# Patient Record
Sex: Female | Born: 1995 | Race: Black or African American | Hispanic: No | Marital: Single | Smoking: Former smoker
Health system: Southern US, Community
[De-identification: ages and names within clinical notes are randomized; demographics above are authoritative.]

## PROBLEM LIST (undated history)

## (undated) DIAGNOSIS — N83209 Unspecified ovarian cyst, unspecified side: Secondary | ICD-10-CM

## (undated) DIAGNOSIS — A749 Chlamydial infection, unspecified: Secondary | ICD-10-CM

## (undated) DIAGNOSIS — I10 Essential (primary) hypertension: Secondary | ICD-10-CM

## (undated) DIAGNOSIS — B999 Unspecified infectious disease: Secondary | ICD-10-CM

## (undated) DIAGNOSIS — O139 Gestational [pregnancy-induced] hypertension without significant proteinuria, unspecified trimester: Secondary | ICD-10-CM

---
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
## 2014-06-02 ENCOUNTER — Encounter (HOSPITAL_COMMUNITY): Payer: Self-pay | Admitting: *Deleted

## 2014-10-29 ENCOUNTER — Ambulatory Visit: Payer: Medicaid Other | Admitting: Obstetrics & Gynecology

## 2015-03-18 ENCOUNTER — Encounter: Payer: Self-pay | Admitting: Family Medicine

## 2015-03-18 ENCOUNTER — Ambulatory Visit (INDEPENDENT_AMBULATORY_CARE_PROVIDER_SITE_OTHER): Payer: Managed Care, Other (non HMO) | Admitting: Family Medicine

## 2015-03-18 VITALS — BP 137/102 | HR 90 | Temp 98.6°F | Ht 67.0 in | Wt 200.5 lb

## 2015-03-18 DIAGNOSIS — Z3049 Encounter for surveillance of other contraceptives: Secondary | ICD-10-CM

## 2015-03-18 DIAGNOSIS — Z3046 Encounter for surveillance of implantable subdermal contraceptive: Secondary | ICD-10-CM

## 2015-03-18 MED ORDER — AMLODIPINE BESYLATE 10 MG PO TABS: 10.0000 mg | ORAL_TABLET | Freq: Every day | ORAL | Status: DC

## 2015-03-18 MED ORDER — NORGESTIMATE-ETH ESTRADIOL 0.25-35 MG-MCG PO TABS: 1.0000 | ORAL_TABLET | Freq: Every day | ORAL | Status: DC

## 2015-03-18 NOTE — Progress Notes (Signed)
Nexplanon Removal:  Patient given informed consent for removal of her Implanon, time out was performed.  Signed copy in the chart.  Appropriate time out taken. Implanon site identified.  Area prepped in usual sterile fashon. 3mL of 1% lidocaine was used to anesthetize the area at the distal end of the implant. A small stab incision was made right beside the implant on the distal portion.  The implanon rod was grasped using hemostats and removed without difficulty.  There was less than 3 cc blood loss. There were no complications.  A small amount of antibiotic ointment and steri-strips were applied over the small incision.  A pressure bandage was applied to reduce any bruising.  The patient tolerated the procedure well and was given post procedure instructions.  Sprintec prescribed.  Instructions on how to use given

## 2015-03-18 NOTE — Addendum Note (Signed)
Addended by: Levie Heritage on: 03/18/2015 02:31 PM   Modules accepted: Orders

## 2015-03-18 NOTE — Progress Notes (Signed)
Patient ID: [name] [name], female   DOB: 1995/11/18, 19 y.o.   MRN: [id number] Pt desires to have nexplanon removed, feels it is the cause of abnormal bleeding, cyst on ovaries.  Pt states she has difficulty sleeping.

## 2015-07-13 ENCOUNTER — Other Ambulatory Visit: Payer: Self-pay | Admitting: Family Medicine

## 2015-09-22 LAB — OB RESULTS CONSOLE HIV ANTIBODY (ROUTINE TESTING): HIV: NONREACTIVE

## 2015-09-22 LAB — OB RESULTS CONSOLE GC/CHLAMYDIA
CHLAMYDIA, DNA PROBE: NEGATIVE
GC PROBE AMP, GENITAL: NEGATIVE

## 2015-09-22 LAB — OB RESULTS CONSOLE ABO/RH: RH Type: POSITIVE

## 2015-09-22 LAB — OB RESULTS CONSOLE HGB/HCT, BLOOD
HCT: 37 %
HEMOGLOBIN: 12.7 g/dL

## 2015-09-22 LAB — OB RESULTS CONSOLE RUBELLA ANTIBODY, IGM: Rubella: IMMUNE

## 2015-09-22 LAB — OB RESULTS CONSOLE HEPATITIS B SURFACE ANTIGEN: HEP B S AG: NEGATIVE

## 2015-09-22 LAB — OB RESULTS CONSOLE RPR: RPR: NONREACTIVE

## 2015-09-22 LAB — OB RESULTS CONSOLE ANTIBODY SCREEN: Antibody Screen: NEGATIVE

## 2015-11-25 ENCOUNTER — Encounter (HOSPITAL_COMMUNITY): Payer: Self-pay | Admitting: *Deleted

## 2015-11-25 ENCOUNTER — Inpatient Hospital Stay (HOSPITAL_COMMUNITY)
Admission: AD | Admit: 2015-11-25 | Discharge: 2015-11-25 | Disposition: A | Discharge status: Home / Self Care | Payer: Managed Care, Other (non HMO) | Source: Ambulatory Visit | Attending: Obstetrics and Gynecology | Admitting: Obstetrics and Gynecology

## 2015-11-25 ENCOUNTER — Encounter: Payer: Self-pay | Admitting: *Deleted

## 2015-11-25 DIAGNOSIS — O10013 Pre-existing essential hypertension complicating pregnancy, third trimester: Secondary | ICD-10-CM | POA: Diagnosis not present

## 2015-11-25 DIAGNOSIS — L259 Unspecified contact dermatitis, unspecified cause: Secondary | ICD-10-CM | POA: Insufficient documentation

## 2015-11-25 DIAGNOSIS — R21 Rash and other nonspecific skin eruption: Secondary | ICD-10-CM | POA: Diagnosis present

## 2015-11-25 DIAGNOSIS — Z3A29 29 weeks gestation of pregnancy: Secondary | ICD-10-CM

## 2015-11-25 DIAGNOSIS — O9989 Other specified diseases and conditions complicating pregnancy, childbirth and the puerperium: Secondary | ICD-10-CM

## 2015-11-25 DIAGNOSIS — Z87891 Personal history of nicotine dependence: Secondary | ICD-10-CM | POA: Diagnosis not present

## 2015-11-25 DIAGNOSIS — O26893 Other specified pregnancy related conditions, third trimester: Secondary | ICD-10-CM | POA: Insufficient documentation

## 2015-11-25 LAB — URINALYSIS, ROUTINE W REFLEX MICROSCOPIC
Bilirubin Urine: NEGATIVE
Glucose, UA: NEGATIVE mg/dL
HGB URINE DIPSTICK: NEGATIVE
Ketones, ur: NEGATIVE mg/dL
LEUKOCYTES UA: NEGATIVE
NITRITE: NEGATIVE
PROTEIN: NEGATIVE mg/dL
Specific Gravity, Urine: 1.02 (ref 1.005–1.030)
pH: 6.5 (ref 5.0–8.0)

## 2015-11-25 MED ORDER — HYDROCORTISONE 1 % EX CREA
TOPICAL_CREAM | CUTANEOUS | Status: DC
Start: 2015-11-25 — End: 2015-11-25

## 2015-11-25 MED ORDER — HYDROCORTISONE 1 % EX CREA: TOPICAL_CREAM | CUTANEOUS | Status: DC

## 2015-11-25 NOTE — H&P (Signed)
[name] Knackbony [name] [name] is an 20 y.o. female. HPI  [name] [name] is a 20 yo female with a history of severe pre-eclampsia who presents with a 2 week history of a bumpy, itchy rash and a one day history of hives. Patient states that she first noticed a rash in her right lower quadrant that occupied her stretch marks (but not the marks on the left side). The rash was very itchy, raised and red. She has treated the itchiness with an alcohol swab which she says provides temporary relief. Patient notes earlier use of a cocoa butter cream recommended to relieve stretch marks which she discontinued after the onset of the rash.   Yesterday, she noted a group of 6+ itchy hives on her left arm. She reports that many have disappeared but several remain and are still itchy. She denies any shortness of breath, tightness of throat, or dizziness at the onset of hives. Similarly, she denies the use of any new cloth, lotions/detergents, or foods over the last several weeks (other than the since discontinued stretch mark cream).   She has no known allergies, history of asthma or eczema.   She confirms continued fetal movements; denies contractions, vaginal bleeding, abdominal pain, and substance use.  Review of Systems  Constitutional: Negative for fever and chills.  Eyes: Negative for blurred vision.  Respiratory: Negative for shortness of breath and wheezing.   Cardiovascular: Negative for chest pain and palpitations.  Gastrointestinal: Positive for blood in stool. Negative for nausea, vomiting, abdominal pain and diarrhea.  Skin: Positive for itching and rash.  Neurological: Negative for dizziness, weakness and headaches.  Endo/Heme/Allergies: Negative for environmental allergies.      Blood pressure 133/84, pulse 78, temperature 98.3 F (36.8 C), temperature source Oral, resp. rate 18, last menstrual period 03/06/2015. Physical Exam  Constitutional: She appears well-developed and well-nourished.   Cardiovascular: Normal rate, regular rhythm, normal heart sounds and intact distal pulses.  Exam reveals no gallop and no friction rub.   No murmur heard. Respiratory: Effort normal and breath sounds normal. No respiratory distress. She has no wheezes. She has no rales. She exhibits no tenderness.  GI: Soft. Bowel sounds are normal. She exhibits no distension. There is no tenderness.  Skin: Rash noted.      Assessment/Plan [name] [name] is a 20 year old presenting with a series of itchy episodes without an atopic history or known allergies. Most likely these are discrete episodes or pruritic rashes and can be managed with with an anti-histamine and topical steroid cream.   [name] 11/25/2015, 3:30 PM

## 2015-11-25 NOTE — Discharge Instructions (Signed)
Contact Dermatitis Dermatitis is redness, soreness, and swelling (inflammation) of the skin. Contact dermatitis is a reaction to certain substances that touch the skin. There are two types of contact dermatitis:   Irritant contact dermatitis. This type is caused by something that irritates your skin, such as dry hands from washing them too much. This type does not require previous exposure to the substance for a reaction to occur. This type is more common.  Allergic contact dermatitis. This type is caused by a substance that you are allergic to, such as a nickel allergy or poison ivy. This type only occurs if you have been exposed to the substance (allergen) before. Upon a repeat exposure, your body reacts to the substance. This type is less common. CAUSES  Many different substances can cause contact dermatitis. Irritant contact dermatitis is most commonly caused by exposure to:   Makeup.   Soaps.   Detergents.   Bleaches.   Acids.   Metal salts, such as nickel.  Allergic contact dermatitis is most commonly caused by exposure to:   Poisonous plants.   Chemicals.   Jewelry.   Latex.   Medicines.   Preservatives in products, such as clothing.  RISK FACTORS This condition is more likely to develop in:   People who have jobs that expose them to irritants or allergens.  People who have certain medical conditions, such as asthma or eczema.  SYMPTOMS  Symptoms of this condition may occur anywhere on your body where the irritant has touched you or is touched by you. Symptoms include:  Dryness or flaking.   Redness.   Cracks.   Itching.   Pain or a burning feeling.   Blisters.  Drainage of small amounts of blood or clear fluid from skin cracks. With allergic contact dermatitis, there may also be swelling in areas such as the eyelids, mouth, or genitals.  DIAGNOSIS  This condition is diagnosed with a medical history and physical exam. A patch skin test  may be performed to help determine the cause. If the condition is related to your job, you may need to see an occupational medicine specialist. TREATMENT Treatment for this condition includes figuring out what caused the reaction and protecting your skin from further contact. Treatment may also include:   Steroid creams or ointments. Oral steroid medicines may be needed in more severe cases.  Antibiotics or antibacterial ointments, if a skin infection is present.  Antihistamine lotion or an antihistamine taken by mouth to ease itching.  A bandage (dressing). HOME CARE INSTRUCTIONS Skin Care  Moisturize your skin as needed.   Apply cool compresses to the affected areas.  Try taking a bath with:  Epsom salts. Follow the instructions on the packaging. You can get these at your local pharmacy or grocery store.  Baking soda. Pour a small amount into the bath as directed by your health care provider.  Colloidal oatmeal. Follow the instructions on the packaging. You can get this at your local pharmacy or grocery store.  Try applying baking soda paste to your skin. Stir water into baking soda until it reaches a paste-like consistency.  Do not scratch your skin.  Bathe less frequently, such as every other day.  Bathe in lukewarm water. Avoid using hot water. Medicines  Take or apply over-the-counter and prescription medicines only as told by your health care provider.   If you were prescribed an antibiotic medicine, take or apply your antibiotic as told by your health care provider. Do not stop using the   antibiotic even if your condition starts to improve. General Instructions  Keep all follow-up visits as told by your health care provider. This is important.  Avoid the substance that caused your reaction. If you do not know what caused it, keep a journal to try to track what caused it. Write down:  What you eat.  What cosmetic products you use.  What you drink.  What  you wear in the affected area. This includes jewelry.  If you were given a dressing, take care of it as told by your health care provider. This includes when to change and remove it. SEEK MEDICAL CARE IF:   Your condition does not improve with treatment.  Your condition gets worse.  You have signs of infection such as swelling, tenderness, redness, soreness, or warmth in the affected area.  You have a fever.  You have new symptoms. SEEK IMMEDIATE MEDICAL CARE IF:   You have a severe headache, neck pain, or neck stiffness.  You vomit.  You feel very sleepy.  You notice red streaks coming from the affected area.  Your bone or joint underneath the affected area becomes painful after the skin has healed.  The affected area turns darker.  You have difficulty breathing.   This information is not intended to replace advice given to you by your health care provider. Make sure you discuss any questions you have with your health care provider.   Document Released: 07/15/2000 Document Revised: 04/08/2015 Document Reviewed: 12/03/2014 Elsevier Interactive Patient Education 2016 [employer]  

## 2015-11-25 NOTE — MAU Note (Signed)
Pt C/O itching & a rash on her abdomen x 2 weeks, broke out in hives on her arms yesterday, is mostly gone now.  Has been nauseated since yesterday, unable to eat but is able to drink.  Denies abd pain, bleeding or LOF.

## 2015-11-25 NOTE — MAU Provider Note (Signed)
Chief Complaint:  Rash and Nausea   First Provider Initiated Contact with Patient 11/25/15 1603      HPI: [name] [name] is a 20 y.o. G2P0101 at 32w6dwho presents to maternity admissions reporting onset of rash x 2 weeks on her left lower abdomen associated with her stretch marks. She reports she was using cocoa butter on her stretch marks but discontinued use when the rash started.  She is using an alcohol swab on the rash which relieves itching.  Yesterday, she had new onset of 6-10 itchy bumps on her left arm. She has not tried any treatments for these.  She reports good fetal movement, denies abdominal pain, LOF, vaginal bleeding, vaginal itching/burning, urinary symptoms, h/a, dizziness, n/v, or fever/chills.     HPI  Past Medical History: Past Medical History  Diagnosis Date  . Medical history non-contributory   . Hypertension     Past obstetric history: OB History  Gravida Para Term Preterm AB SAB TAB Ectopic Multiple Living     # Outcome Date GA Lbr Len/2nd Weight Sex Delivery Anes PTL Lv  2 Current           1 Preterm 03/21/13 [redacted]w[redacted]d  3 lb 1 oz (1.389 kg) F CS-LTranv Spinal  Y      Past Surgical History: Past Surgical History  Procedure Laterality Date  . No past surgeries    . Cesarean section N/A 03/21/2013    Procedure: CESAREAN SECTION;  Surgeon: Willodean Rosenthal, MD;  Location: [facility];  Service: Obstetrics;  Laterality: N/A;    Family History: Family History  Problem Relation Age of Onset  . Asthma Mother     Social History: Social History  Substance Use Topics  . Smoking status: Former Smoker    Quit date: 07/27/2015  . Smokeless tobacco: Never Used  . Alcohol Use: No    Allergies: No Known Allergies  Meds:  Prescriptions prior to admission  Medication Sig Dispense Refill Last Dose  . acetaminophen (TYLENOL) 500 MG tablet Take 1,000 mg by mouth every 6 (six) hours as needed for mild pain.   Taking  . amLODipine (NORVASC)  10 MG tablet TAKE 1 TABLET (10 MG TOTAL) BY MOUTH DAILY. 60 tablet 0   . norgestimate-ethinyl estradiol (ORTHO-CYCLEN,SPRINTEC,PREVIFEM) 0.25-35 MG-MCG tablet Take 1 tablet by mouth daily. 1 Package 11     ROS:  Review of Systems  Constitutional: Negative for fever, chills and fatigue.  Eyes: Negative for visual disturbance.  Respiratory: Negative for shortness of breath.   Cardiovascular: Negative for chest pain.  Gastrointestinal: Positive for nausea. Negative for vomiting and abdominal pain.  Genitourinary: Negative for dysuria, flank pain, vaginal bleeding, vaginal discharge, difficulty urinating, vaginal pain and pelvic pain.  Skin: Positive for rash.  Neurological: Negative for dizziness and headaches.  Psychiatric/Behavioral: Negative.      I have reviewed patient's Past Medical Hx, Surgical Hx, Family Hx, Social Hx, medications and allergies.   Physical Exam  Patient Vitals for the past 24 hrs:  BP Temp Temp src Pulse Resp  11/25/15 1301 133/84 mmHg 98.3 F (36.8 C) Oral 78 18   Constitutional: Well-developed, well-nourished female in no acute distress.  Cardiovascular: normal rate Respiratory: normal effort GI: Abd soft, non-tender, gravid appropriate for gestational age.  MS: Extremities nontender, no edema, normal ROM Neurologic: Alert and oriented x 4.  GU: Neg CVAT.  Skin with raised, red papules along lines of striae on abdomen, and raised  red papules on left arm with erythema from scratching noted    FHT:  Baseline 145 , moderate variability, accelerations present, no decelerations Contractions: None on toco or to palpation   Labs: Results for orders placed or performed during the hospital encounter of 11/25/15 (from the past 24 hour(s))  Urinalysis, Routine w reflex microscopic (not at [facility])     Status: None   Collection Time: 11/25/15  1:05 PM  Result Value Ref Range   Color, Urine YELLOW YELLOW   APPearance CLEAR CLEAR   Specific Gravity, Urine 1.020  1.005 - 1.030   pH 6.5 5.0 - 8.0   Glucose, UA NEGATIVE NEGATIVE mg/dL   Hgb urine dipstick NEGATIVE NEGATIVE   Bilirubin Urine NEGATIVE NEGATIVE   Ketones, ur NEGATIVE NEGATIVE mg/dL   Protein, ur NEGATIVE NEGATIVE mg/dL   Nitrite NEGATIVE NEGATIVE   Leukocytes, UA NEGATIVE NEGATIVE      Imaging:  No results found.  MAU Course/MDM: I have ordered labs and reviewed results.  Rash in striae but otherwise not consistent with PUPPP since it is localized to one small area of abdomen.  Rash on left arm with similar itching but different appearance.  Most likely contact dermatitis although pt unaware of cause.  Will treat with topical hydrocortisone 1% cream BID and PO Benadryl 25 mg PRN.  F/U if rash persists or worsens.  Pt stable at time of discharge.  Assessment: 1. Contact dermatitis   2. [redacted] weeks gestation of pregnancy     Plan: Discharge home Labor precautions and fetal kick counts    Medication List    STOP taking these medications        amLODipine 10 MG tablet  Commonly known as:  NORVASC     norgestimate-ethinyl estradiol 0.25-35 MG-MCG tablet  Commonly known as:  ORTHO-CYCLEN,SPRINTEC,PREVIFEM      TAKE these medications        acetaminophen 500 MG tablet  Commonly known as:  TYLENOL  Take 1,000 mg by mouth every 6 (six) hours as needed for mild pain.     hydrocortisone cream 1 %  Apply to affected area 2 times daily       Follow-up Information    [facility]   Specialty:  Obstetrics and Gynecology   Why:  Use Benadryl by mouth according to package directions as needed for itching and hydrocortisone cream as prescribed   Contact information:   [address] [place] [id number] [phone]      Follow [facility] with [facility].   Why:  As needed for emergencies   Contact information:   [address] [id number] mc [place]  [id number] [phone]       Medication List    STOP taking these medications        amLODipine 10 MG tablet  Commonly known as:  NORVASC     norgestimate-ethinyl estradiol 0.25-35 MG-MCG tablet  Commonly known as:  ORTHO-CYCLEN,SPRINTEC,PREVIFEM      TAKE these medications        acetaminophen 500 MG tablet  Commonly known as:  TYLENOL  Take 1,000 mg by mouth every 6 (six) hours as needed for mild pain.     hydrocortisone cream 1 %  Apply to affected area 2 times daily        Sharen CounterLisa Leftwich-Kirby Certified Nurse-Midwife 11/25/2015 4:09 PM

## 2015-12-09 ENCOUNTER — Encounter (HOSPITAL_COMMUNITY): Payer: Self-pay | Admitting: *Deleted

## 2015-12-09 ENCOUNTER — Inpatient Hospital Stay (HOSPITAL_COMMUNITY)
Admission: AD | Admit: 2015-12-09 | Discharge: 2015-12-12 | DRG: 781 | Disposition: A | Discharge status: Home / Self Care | Payer: Managed Care, Other (non HMO) | Source: Ambulatory Visit | Attending: Obstetrics and Gynecology | Admitting: Obstetrics and Gynecology

## 2015-12-09 DIAGNOSIS — O10013 Pre-existing essential hypertension complicating pregnancy, third trimester: Secondary | ICD-10-CM | POA: Diagnosis present

## 2015-12-09 DIAGNOSIS — O113 Pre-existing hypertension with pre-eclampsia, third trimester: Principal | ICD-10-CM | POA: Diagnosis present

## 2015-12-09 DIAGNOSIS — O10919 Unspecified pre-existing hypertension complicating pregnancy, unspecified trimester: Secondary | ICD-10-CM | POA: Insufficient documentation

## 2015-12-09 DIAGNOSIS — Z3A32 32 weeks gestation of pregnancy: Secondary | ICD-10-CM

## 2015-12-09 DIAGNOSIS — Z825 Family history of asthma and other chronic lower respiratory diseases: Secondary | ICD-10-CM

## 2015-12-09 DIAGNOSIS — Z87891 Personal history of nicotine dependence: Secondary | ICD-10-CM

## 2015-12-09 DIAGNOSIS — O1490 Unspecified pre-eclampsia, unspecified trimester: Secondary | ICD-10-CM

## 2015-12-09 DIAGNOSIS — O10913 Unspecified pre-existing hypertension complicating pregnancy, third trimester: Secondary | ICD-10-CM

## 2015-12-09 NOTE — MAU Note (Signed)
Pt states she took her B/P med earlier today and later she checked her B/P was 150/103 and it stayed up. Pt has been on meds for 4 years for HBP

## 2015-12-10 ENCOUNTER — Inpatient Hospital Stay (HOSPITAL_COMMUNITY): Payer: Managed Care, Other (non HMO)

## 2015-12-10 DIAGNOSIS — O10013 Pre-existing essential hypertension complicating pregnancy, third trimester: Secondary | ICD-10-CM | POA: Diagnosis present

## 2015-12-10 DIAGNOSIS — O113 Pre-existing hypertension with pre-eclampsia, third trimester: Secondary | ICD-10-CM | POA: Diagnosis present

## 2015-12-10 DIAGNOSIS — Z87891 Personal history of nicotine dependence: Secondary | ICD-10-CM | POA: Diagnosis not present

## 2015-12-10 DIAGNOSIS — O1490 Unspecified pre-eclampsia, unspecified trimester: Secondary | ICD-10-CM | POA: Diagnosis present

## 2015-12-10 DIAGNOSIS — Z3A32 32 weeks gestation of pregnancy: Secondary | ICD-10-CM | POA: Diagnosis not present

## 2015-12-10 DIAGNOSIS — Z825 Family history of asthma and other chronic lower respiratory diseases: Secondary | ICD-10-CM | POA: Diagnosis not present

## 2015-12-10 LAB — PROTEIN / CREATININE RATIO, URINE
Creatinine, Urine: 286 mg/dL
Protein Creatinine Ratio: 0.12 mg/mg{Cre} (ref 0.00–0.15)
Total Protein, Urine: 35 mg/dL

## 2015-12-10 LAB — URINE MICROSCOPIC-ADD ON

## 2015-12-10 LAB — COMPREHENSIVE METABOLIC PANEL
ALBUMIN: 3.2 g/dL — AB (ref 3.5–5.0)
ALT: 14 U/L (ref 14–54)
AST: 18 U/L (ref 15–41)
Alkaline Phosphatase: 75 U/L (ref 38–126)
Anion gap: 6 (ref 5–15)
BILIRUBIN TOTAL: 0.2 mg/dL — AB (ref 0.3–1.2)
BUN: 10 mg/dL (ref 6–20)
CHLORIDE: 109 mmol/L (ref 101–111)
CO2: 22 mmol/L (ref 22–32)
Calcium: 9 mg/dL (ref 8.9–10.3)
Creatinine, Ser: 0.66 mg/dL (ref 0.44–1.00)
GFR calc Af Amer: >60 mL/min (ref >60)
GFR calc non Af Amer: >60 mL/min (ref >60)
GLUCOSE: 88 mg/dL (ref 65–99)
POTASSIUM: 4 mmol/L (ref 3.5–5.1)
Sodium: 137 mmol/L (ref 135–145)
Total Protein: 7 g/dL (ref 6.5–8.1)

## 2015-12-10 LAB — CBC
HEMATOCRIT: 36 % (ref 36.0–46.0)
Hemoglobin: 12.2 g/dL (ref 12.0–15.0)
MCH: 29.5 pg (ref 26.0–34.0)
MCHC: 33.9 g/dL (ref 30.0–36.0)
MCV: 87 fL (ref 78.0–100.0)
PLATELETS: 292 10*3/uL (ref 150–400)
RBC: 4.14 MIL/uL (ref 3.87–5.11)
RDW: 13.2 % (ref 11.5–15.5)
WBC: 14 10*3/uL — AB (ref 4.0–10.5)

## 2015-12-10 LAB — OB RESULTS CONSOLE GBS: STREP GROUP B AG: POSITIVE

## 2015-12-10 LAB — URINALYSIS, ROUTINE W REFLEX MICROSCOPIC
Bilirubin Urine: NEGATIVE
Glucose, UA: NEGATIVE mg/dL
Hgb urine dipstick: NEGATIVE
Ketones, ur: NEGATIVE mg/dL
Nitrite: NEGATIVE
Protein, ur: 30 mg/dL — AB
SPECIFIC GRAVITY, URINE: 1.025 (ref 1.005–1.030)
pH: 6.5 (ref 5.0–8.0)

## 2015-12-10 LAB — TYPE AND SCREEN
ABO/RH(D): B POS
Antibody Screen: NEGATIVE

## 2015-12-10 MED ORDER — HYDRALAZINE HCL 20 MG/ML IJ SOLN: 10.0000 mg | Freq: Once | INTRAMUSCULAR | Status: DC | PRN

## 2015-12-10 MED ORDER — LABETALOL HCL 5 MG/ML IV SOLN
20.0000 mg | INTRAVENOUS | Status: DC | PRN
  Administered 2015-12-10: 20 mg via INTRAVENOUS
  Filled 2015-12-10: qty 4

## 2015-12-10 MED ORDER — LACTATED RINGERS IV SOLN: INTRAVENOUS | Status: DC

## 2015-12-10 MED ORDER — BETAMETHASONE SOD PHOS & ACET 6 (3-3) MG/ML IJ SUSP
12.0000 mg | INTRAMUSCULAR | Status: AC
  Administered 2015-12-10 – 2015-12-11 (×2): 12 mg via INTRAMUSCULAR
  Filled 2015-12-10 (×3): qty 2

## 2015-12-10 MED ORDER — DOCUSATE SODIUM 100 MG PO CAPS
100.0000 mg | ORAL_CAPSULE | Freq: Every day | ORAL | Status: DC
  Administered 2015-12-10 – 2015-12-12 (×3): 100 mg via ORAL
  Filled 2015-12-10 (×4): qty 1

## 2015-12-10 MED ORDER — LABETALOL HCL 100 MG PO TABS
200.0000 mg | ORAL_TABLET | Freq: Three times a day (TID) | ORAL | Status: DC
  Administered 2015-12-10 – 2015-12-12 (×7): 200 mg via ORAL
  Filled 2015-12-10 (×3): qty 2
  Filled 2015-12-10: qty 1
  Filled 2015-12-10: qty 2
  Filled 2015-12-10 (×2): qty 1
  Filled 2015-12-10: qty 2
  Filled 2015-12-10: qty 1
  Filled 2015-12-10: qty 2

## 2015-12-10 MED ORDER — PRENATAL MULTIVITAMIN CH
1.0000 | ORAL_TABLET | Freq: Every day | ORAL | Status: DC
  Administered 2015-12-10 – 2015-12-11 (×2): 1 via ORAL
  Filled 2015-12-10 (×3): qty 1

## 2015-12-10 MED ORDER — CALCIUM CARBONATE ANTACID 500 MG PO CHEW
2.0000 | CHEWABLE_TABLET | ORAL | Status: DC | PRN
  Filled 2015-12-10: qty 2

## 2015-12-10 MED ORDER — ZOLPIDEM TARTRATE 5 MG PO TABS: 5.0000 mg | ORAL_TABLET | Freq: Every evening | ORAL | Status: DC | PRN

## 2015-12-10 MED ORDER — LABETALOL HCL 5 MG/ML IV SOLN: 20.0000 mg | INTRAVENOUS | Status: DC | PRN

## 2015-12-10 MED ORDER — ACETAMINOPHEN 325 MG PO TABS: 650.0000 mg | ORAL_TABLET | ORAL | Status: DC | PRN

## 2015-12-10 NOTE — Progress Notes (Signed)
Pt refusing FHR monitoring at this time wants to wait

## 2015-12-10 NOTE — H&P (Signed)
ANTEPARTUM ADMISSION HISTORY AND PHYSICAL NOTE   History of Present Illness: [name] [name] is a 20 y.o. G2P0101 at 1874w0d admitted for preeclampsia.  Taking labetalol 100 bid. Care in [place], transferring here.   Today went to grandma's house as has been recently suffering headaches and seeing spots. At grandmother's 150s/100s. Went to fire station and checked and found to be 160s/100s. Currently mild headache, seeing spots, no epigastric/ruq abd pain, no sob. No vaginal bleeding or leakage of fluid.   Patient Active Problem List   Diagnosis Date Noted  . Preeclampsia 12/10/2015  . Severe preeclampsia, antepartum 02/27/2013  . Supervision of high-risk pregnancy 11/14/2012    Past Medical History  Diagnosis Date  . Hypertension     Past Surgical History  Procedure Laterality Date  . Cesarean section N/A 03/21/2013    Procedure: CESAREAN SECTION;  Surgeon: Willodean Rosenthalarolyn [name], MD;  Location: [facility];  Service: Obstetrics;  Laterality: N/A;    OB History  Gravida Para Term Preterm AB SAB TAB Ectopic Multiple Living  2 1 0 1 0 0 0 0 0 1     # Outcome Date GA Lbr Len/2nd Weight Sex Delivery Anes PTL Lv  2 Current           1 Preterm 03/21/13 2353w5d  3 lb 1 oz (1.389 kg) F CS-LTranv Spinal  Y      Social History   Social History  . Marital Status: Single    Spouse Name: N/A  . Number of Children: N/A  . Years of Education: N/A   Social History Main Topics  . Smoking status: Former Smoker    Quit date: 07/27/2015  . Smokeless tobacco: Never Used  . Alcohol Use: No  . Drug Use: No  . Sexual Activity: Yes    Birth Control/ Protection: None   Other Topics Concern  . None   Social History Narrative    Family History  Problem Relation Age of Onset  . Asthma Mother     No Known Allergies  Prescriptions prior to admission  Medication Sig Dispense Refill Last Dose  . acetaminophen (TYLENOL) 500 MG tablet Take 1,000 mg by mouth every 6 (six) hours as needed  for mild pain.   Taking  . hydrocortisone cream 1 % Apply to affected area 2 times daily 15 g 0     Review of Systems - Negative except as per hpi  Vitals:  BP 165/99 mmHg  Pulse 61  Temp(Src) 98.3 F (36.8 C) (Oral)  Resp 18  Ht 5\' 6"  (1.676 m)  Wt 221 lb (100.245 kg)  BMI 35.69 kg/m2  SpO2 100%  LMP 03/06/2015 Physical Examination: CONSTITUTIONAL: Well-developed, well-nourished female in no acute distress.  HENT:  Normocephalic, atraumatic, External right and left ear normal. Oropharynx is clear and moist EYES: Conjunctivae and EOM are normal. Pupils are equal, round, and reactive to light. No scleral icterus.  NECK: Normal range of motion, supple, no masses SKIN: Skin is warm and dry. No rash noted. Not diaphoretic. No erythema. No pallor. NEUROLGIC: Alert and oriented to person, place, and time. Normal reflexes, muscle tone coordination. No cranial nerve deficit noted. PSYCHIATRIC: Normal mood and affect. Normal behavior. Normal judgment and thought content. CARDIOVASCULAR: Normal heart rate noted, regular rhythm RESPIRATORY: Effort and breath sounds normal, no problems with respiration noted ABDOMEN: Soft, nontender, nondistended, gravid. MUSCULOSKELETAL: Normal range of motion. No edema and no tenderness. 2+ distal pulses.  Cervix: closed/thick/high. Vertex Membranes:intact Fetal Monitoring:145/mod/+a/-d Tocometer: Flat  Labs:  Results  for orders placed or performed during the hospital encounter of 12/09/15 (from the past 24 hour(s))  Protein / creatinine ratio, urine   Collection Time: 12/09/15 11:42 PM  Result Value Ref Range   Creatinine, Urine 286.00 mg/dL   Total Protein, Urine 35 mg/dL   Protein Creatinine Ratio 0.12 0.00 - 0.15 mg/mg[Cre]  Urinalysis, Routine w reflex microscopic (not at [facility])   Collection Time: 12/09/15 11:42 PM  Result Value Ref Range   Color, Urine YELLOW YELLOW   APPearance CLEAR CLEAR   Specific Gravity, Urine 1.025 1.005 - 1.030   pH  6.5 5.0 - 8.0   Glucose, UA NEGATIVE NEGATIVE mg/dL   Hgb urine dipstick NEGATIVE NEGATIVE   Bilirubin Urine NEGATIVE NEGATIVE   Ketones, ur NEGATIVE NEGATIVE mg/dL   Protein, ur 30 (A) NEGATIVE mg/dL   Nitrite NEGATIVE NEGATIVE   Leukocytes, UA TRACE (A) NEGATIVE  Urine microscopic-add on   Collection Time: 12/09/15 11:42 PM  Result Value Ref Range   Squamous Epithelial / LPF 0-5 (A) NONE SEEN   WBC, UA 0-5 0 - 5 WBC/hpf   RBC / HPF 0-5 0 - 5 RBC/hpf   Bacteria, UA FEW (A) NONE SEEN  CBC   Collection Time: 12/10/15 12:28 AM  Result Value Ref Range   WBC 14.0 (H) 4.0 - 10.5 K/uL   RBC 4.14 3.87 - 5.11 MIL/uL   Hemoglobin 12.2 12.0 - 15.0 g/dL   HCT 16.1 09.6 - 04.5 %   MCV 87.0 78.0 - 100.0 fL   MCH 29.5 26.0 - 34.0 pg   MCHC 33.9 30.0 - 36.0 g/dL   RDW 40.9 81.1 - 91.4 %   Platelets 292 150 - 400 K/uL    Imaging Studies: No results found.   Assessment and Plan:  # Preclampsia - severe (BP, symptoms). - f/u CMP, CBC - holding on Mg after discussion w/ Dr. Emelda Fear, will attempt to control BP first - bmz now and again in 24 hours - u/s in the AM - 24-hour urine - increase labetalol to 200 tid and initiate IV labetalol protocol - desires tolac, fetus vertex  Admit to Antenatal Routine antenatal care  Silvano Bilis, MD OB Fellow Faculty Practice, [facility]

## 2015-12-11 ENCOUNTER — Inpatient Hospital Stay (HOSPITAL_COMMUNITY): Payer: Managed Care, Other (non HMO)

## 2015-12-11 DIAGNOSIS — O10919 Unspecified pre-existing hypertension complicating pregnancy, unspecified trimester: Secondary | ICD-10-CM | POA: Insufficient documentation

## 2015-12-11 DIAGNOSIS — O10913 Unspecified pre-existing hypertension complicating pregnancy, third trimester: Secondary | ICD-10-CM | POA: Diagnosis not present

## 2015-12-11 DIAGNOSIS — Z3A32 32 weeks gestation of pregnancy: Secondary | ICD-10-CM | POA: Diagnosis not present

## 2015-12-11 LAB — CULTURE, BETA STREP (GROUP B ONLY)

## 2015-12-11 LAB — CREATININE CLEARANCE, URINE, 24 HOUR
COLLECTION INTERVAL-CRCL: 24 h
CREATININE 24H UR: 1869 mg/d — AB (ref 600–1800)
CREATININE, URINE: 101.05 mg/dL
Creatinine Clearance: 197 mL/min — ABNORMAL HIGH (ref 75–115)
Urine Total Volume-CRCL: 1850 mL

## 2015-12-11 LAB — PROTEIN, URINE, 24 HOUR
Collection Interval-UPROT: 24 hours
PROTEIN, 24H URINE: 296 mg/d — AB (ref 50–100)
Protein, Urine: 16 mg/dL
Urine Total Volume-UPROT: 1850 mL

## 2015-12-11 MED ORDER — SODIUM CHLORIDE 0.9% FLUSH
3.0000 mL | Freq: Two times a day (BID) | INTRAVENOUS | Status: DC
  Administered 2015-12-11 (×2): 3 mL via INTRAVENOUS

## 2015-12-11 NOTE — Progress Notes (Signed)
Patient ID: [name] [name], female   DOB: 10/07/1995, 20 y.o.   MRN: [id number] FACULTY PRACTICE ANTEPARTUM(COMPREHENSIVE) NOTE  [name] [name] is a 20 y.o. G2P0101 at 4928w1d  who [facility] superimposed [facility].    Fetal presentation is cephalic. Length of Stay:  1  Days  Date of admission:12/09/2015  Subjective: Patient reports feeling well. She denies HA, visual changes, RUQ/epigastric pain Patient reports the fetal movement as active. Patient reports uterine contraction  activity as none. Patient reports  vaginal bleeding as none. Patient describes fluid per vagina as None.  Vitals:  Blood pressure 151/85, pulse 81, temperature 98.4 F (36.9 C), temperature source Oral, resp. rate 16, height 5\' 6"  (1.676 m), weight 221 lb (100.245 kg), last menstrual period 03/06/2015, SpO2 100 %. Filed Vitals:   12/10/15 1631 12/10/15 1811 12/10/15 2000 12/10/15 2157  BP: 140/83 133/75 142/81 151/85  Pulse: 76 92 84 81  Temp: 98.1 F (36.7 C)  98.6 F (37 C) 98.4 F (36.9 C)  TempSrc: Oral  Oral Oral  Resp: 20  18 16   Height:      Weight:      SpO2:       Physical Examination:  General appearance - alert, well appearing, and in no distress Fundal Height:  size equals dates Pelvic Exam:  examination not indicated Cervical Exam: Not evaluated. Extremities: extremities normal, atraumatic, no cyanosis or edema with DTRs 2+ bilaterally Membranes:intact  Fetal Monitoring:  Baseline: 150 bpm, Variability: Good {> 6 bpm), Accelerations: Reactive, Decelerations: Absent and contractions irregular     Labs:  No results found for this or any previous visit (from the past 24 hour(s)).  Imaging Studies:      Medications:  Scheduled . docusate sodium  100 mg Oral Daily  . labetalol  200 mg Oral TID  . prenatal multivitamin  1 tablet Oral Q1200   I have reviewed the patient's current medications.  ASSESSMENT: [id number] 2128w1d Estimated Date of Delivery: 02/04/16  Patient  Active Problem List   Diagnosis Date Noted  . Preeclampsia 12/10/2015  . Severe preeclampsia, antepartum 02/27/2013  . Supervision of high-risk pregnancy 11/14/2012    PLAN: 20 yo G2P0101 at 6828w1d with CHTN admitted to rule out preeeclampsia 1) CHTN - BP better controlled on labetalol 3 times daily - 24 hr urine protein 296 mg - follow up growth ultrasound today  2) Fetal - Category I tracing - Follow up growth ultrasound - s/p betamethasone earlier this morning  Continue current antepartum care  Jahyra Sukup 12/11/2015,6:56 AM

## 2015-12-12 MED ORDER — LABETALOL HCL 200 MG PO TABS
200.0000 mg | ORAL_TABLET | Freq: Three times a day (TID) | ORAL | Status: DC
Start: 2015-12-12 — End: 2015-12-22

## 2015-12-12 NOTE — Progress Notes (Signed)
Dr Lowry RamHaraway Smith reviewed FHR tracing OK for pt to continue with discharge

## 2015-12-12 NOTE — Progress Notes (Signed)
Patient ID: [name] [name], female   DOB: 02/05/1996, 20 y.o.   MRN: [id number] Patient ID: [name] [name], female   DOB: 06/04/1996, 20 y.o.   MRN: [id number] FACULTY PRACTICE ANTEPARTUM(COMPREHENSIVE) NOTE  [name] [name] is a 20 y.o. G2P0101 at 281w2d   who [facility] our superimposed preeclampsia.    Fetal presentation is cephalic. Length of Stay:  2  Days  Date of admission:12/09/2015  Subjective: Patient reports feeling well. She denies HA, visual changes, RUQ/epigastric pain Patient reports the fetal movement as active. Patient reports uterine contraction  activity as none. Patient reports  vaginal bleeding as none. Patient describes fluid per vagina as None.  Vitals:  Blood pressure 132/55, pulse 88, temperature 98.8 F (37.1 C), temperature source Oral, resp. rate 18, height 5\' 6"  (1.676 m), weight 221 lb (100.245 kg), last menstrual period 03/06/2015, SpO2 100 %. Filed Vitals:   12/11/15 1716 12/11/15 1720 12/11/15 2110 12/12/15 0128  BP:  140/80 143/89 132/55  Pulse:  58 60 88  Temp: 98.7 F (37.1 C)  99 F (37.2 C) 98.8 F (37.1 C)  TempSrc:   Oral Oral  Resp: 18  18   Height:      Weight:      SpO2:       Physical Examination:  General appearance - alert, well appearing, and in no distress Fundal Height:  size equals dates Pelvic Exam:  examination not indicated Cervical Exam: Not evaluated. Extremities: extremities normal, atraumatic, no cyanosis or edema with DTRs 2+ bilaterally Membranes:intact  Fetal Monitoring:  Baseline: 150 bpm, Variability: Good {> 6 bpm), Accelerations: Reactive, Decelerations: Absent and contractions irregular     Labs:  No results found for this or any previous visit (from the past 24 hour(s)).  Imaging Studies:      Medications:  Scheduled . docusate sodium  100 mg Oral Daily  . labetalol  200 mg Oral TID  . prenatal multivitamin  1 tablet Oral Q1200  . sodium chloride flush  3 mL Intravenous Q12H   I have  reviewed the patient's current medications.  ASSESSMENT: [id number] 6241w1d Estimated Date of Delivery: 02/04/16  Patient Active Problem List   Diagnosis Date Noted  . HTN in pregnancy, chronic   . Preeclampsia 12/10/2015  . Severe preeclampsia, antepartum 02/27/2013  . Supervision of high-risk pregnancy 11/14/2012    PLAN: 20 yo G2P0101 at 4141w1d with CHTN admitted to rule out preeeclampsia 1) CHTN - BP better controlled on labetalol 3 times daily - 24 hr urine protein 296 mg - Fetal growth and fluid good EFW 51%  2) Fetal - Category I tracing - Follow up growth ultrasound - s/p betamethasone earlier this morning  Continue current antepartum care  Will discuss management in house vs outpatient  EURE,LUTHER H 12/12/2015,7:36 AM

## 2015-12-12 NOTE — Discharge Summary (Signed)
OB Discharge Summary     Patient Name: [name] [name] DOB: [birth year] MRN: [id number]  Date of admission: 12/09/2015 Delivering MD: This patient has no babies on file.  Date of discharge: 12/12/2015  Admitting diagnosis: 31 WEEKS HBP Intrauterine pregnancy: [redacted]w[redacted]d     Secondary diagnosis:  Active Problems:   Preeclampsia   HTN in pregnancy, chronic  Additional problems:      Discharge diagnosis: CHTN    With evolving superimposed pre eclampsia                                                                                            Post partum procedures:  Augmentation:   Complications:   Hospital course:  Pt was admitted with 1 week history of bifrontal headache and developed visual changes with "silver fish".  Labs are normal with a 24 hour urine which is just sub pre eclamptic level 296.  Bp meds adjusted to 200 TID and BP have stabilized.  Her headache and all visual changes have resolved. She received betamethasone and her fetal testing has been reassuring As a result she is discharged and given strict precautions with an appointment in the high risk clinic on Thursday.  She desires to transfer her care from [place] area.  Physical exam  Filed Vitals:   12/11/15 1720 12/11/15 2110 12/12/15 0128 12/12/15 0823  BP: 140/80 143/89 132/55 137/74  Pulse: 58 60 88 56  Temp:  99 F (37.2 C) 98.8 F (37.1 C) 98.2 F (36.8 C)  TempSrc:  Oral Oral Oral  Resp:  18  20  Height:      Weight:      SpO2:       General: alert, cooperative and no distress Lochia:  Uterine Fundus:  Incision:  DVT Evaluation: No evidence of DVT seen on physical exam. Labs: Lab Results  Component Value Date   WBC 14.0* 12/10/2015   HGB 12.2 12/10/2015   HCT 36.0 12/10/2015   MCV 87.0 12/10/2015   PLT 292 12/10/2015   CMP Latest Ref Rng 12/10/2015  Glucose 65 - 99 mg/dL 88  BUN 6 - 20 mg/dL 10  Creatinine 4.09 - 8.11 mg/dL 9.14  Sodium 782 - 956 mmol/L 137  Potassium 3.5 - 5.1 mmol/L  4.0  Chloride 101 - 111 mmol/L 109  CO2 22 - 32 mmol/L 22  Calcium 8.9 - 10.3 mg/dL 9.0  Total Protein 6.5 - 8.1 g/dL 7.0  Total Bilirubin 0.3 - 1.2 mg/dL 2.1(H)  Alkaline Phos 38 - 126 U/L 75  AST 15 - 41 U/L 18  ALT 14 - 54 U/L 14    Discharge instruction: per After Visit Summary and "Baby and Me Booklet".  After visit meds:    Medication List    TAKE these medications        diphenhydramine-acetaminophen 25-500 MG Tabs tablet  Commonly known as:  TYLENOL PM  Take 1 tablet by mouth at bedtime as needed.     labetalol 200 MG tablet  Commonly known as:  NORMODYNE  Take 1 tablet (200 mg total) by mouth 3 (three) times daily.  prenatal multivitamin Tabs tablet  Take 1 tablet by mouth daily at 12 noon.        Diet: routine diet  Activity: Advance as tolerated. Pelvic rest for 6 weeks.   Outpatient follow up:5 days, appt below is moved up to 12/17/2015 Follow up Appt:Future Appointments Date Time Provider Department Center  12/22/2015 7:40 AM Rhea PinkLori A Clemmons, CNM [facility]   Follow up Visit:No Follow-up on file.  Postpartum contraception:  Newborn Data: This patient has no babies on file. Baby Feeding:  Disposition:   12/12/2015 Lazaro ArmsEURE,LUTHER H, MD

## 2015-12-22 ENCOUNTER — Ambulatory Visit (INDEPENDENT_AMBULATORY_CARE_PROVIDER_SITE_OTHER): Payer: Medicaid Other | Admitting: Certified Nurse Midwife

## 2015-12-22 ENCOUNTER — Other Ambulatory Visit: Payer: Self-pay | Admitting: Certified Nurse Midwife

## 2015-12-22 ENCOUNTER — Encounter: Payer: Self-pay | Admitting: Certified Nurse Midwife

## 2015-12-22 ENCOUNTER — Inpatient Hospital Stay (HOSPITAL_COMMUNITY)
Admission: AD | Admit: 2015-12-22 | Discharge: 2015-12-22 | Disposition: A | Discharge status: Home / Self Care | Payer: Medicaid Other | Source: Ambulatory Visit | Attending: Family Medicine | Admitting: Family Medicine

## 2015-12-22 ENCOUNTER — Encounter (HOSPITAL_COMMUNITY): Payer: Self-pay | Admitting: *Deleted

## 2015-12-22 VITALS — BP 152/94 | HR 62 | Temp 98.6°F | Wt 221.0 lb

## 2015-12-22 DIAGNOSIS — Z87891 Personal history of nicotine dependence: Secondary | ICD-10-CM | POA: Diagnosis not present

## 2015-12-22 DIAGNOSIS — O10013 Pre-existing essential hypertension complicating pregnancy, third trimester: Secondary | ICD-10-CM | POA: Insufficient documentation

## 2015-12-22 DIAGNOSIS — O0993 Supervision of high risk pregnancy, unspecified, third trimester: Secondary | ICD-10-CM

## 2015-12-22 DIAGNOSIS — O113 Pre-existing hypertension with pre-eclampsia, third trimester: Secondary | ICD-10-CM | POA: Diagnosis not present

## 2015-12-22 DIAGNOSIS — O1493 Unspecified pre-eclampsia, third trimester: Secondary | ICD-10-CM

## 2015-12-22 DIAGNOSIS — Z3A33 33 weeks gestation of pregnancy: Secondary | ICD-10-CM | POA: Diagnosis not present

## 2015-12-22 DIAGNOSIS — O10913 Unspecified pre-existing hypertension complicating pregnancy, third trimester: Secondary | ICD-10-CM

## 2015-12-22 HISTORY — DX: Chlamydial infection, unspecified: A74.9

## 2015-12-22 HISTORY — DX: Unspecified ovarian cyst, unspecified side: N83.209

## 2015-12-22 HISTORY — DX: Unspecified infectious disease: B99.9

## 2015-12-22 HISTORY — DX: Gestational (pregnancy-induced) hypertension without significant proteinuria, unspecified trimester: O13.9

## 2015-12-22 LAB — PROTEIN / CREATININE RATIO, URINE
Creatinine, Urine: 218 mg/dL
PROTEIN CREATININE RATIO: 0.39 mg/mg{creat} — AB (ref 0.00–0.15)
Total Protein, Urine: 86 mg/dL

## 2015-12-22 LAB — POCT URINALYSIS DIP (DEVICE)
Glucose, UA: NEGATIVE mg/dL
Hgb urine dipstick: NEGATIVE
Leukocytes, UA: NEGATIVE
Nitrite: NEGATIVE
Protein, ur: 100 mg/dL — AB
Specific Gravity, Urine: 1.025 (ref 1.005–1.030)
Urobilinogen, UA: 1 mg/dL (ref 0.0–1.0)
pH: 6.5 (ref 5.0–8.0)

## 2015-12-22 LAB — CBC
HEMATOCRIT: 38.4 % (ref 36.0–46.0)
Hemoglobin: 12.9 g/dL (ref 12.0–15.0)
MCH: 29.5 pg (ref 26.0–34.0)
MCHC: 33.6 g/dL (ref 30.0–36.0)
MCV: 87.7 fL (ref 78.0–100.0)
PLATELETS: 304 10*3/uL (ref 150–400)
RBC: 4.38 MIL/uL (ref 3.87–5.11)
RDW: 13.3 % (ref 11.5–15.5)
WBC: 12.9 10*3/uL — AB (ref 4.0–10.5)

## 2015-12-22 LAB — COMPREHENSIVE METABOLIC PANEL
ALBUMIN: 3.3 g/dL — AB (ref 3.5–5.0)
ALT: 14 U/L (ref 14–54)
AST: 20 U/L (ref 15–41)
Alkaline Phosphatase: 93 U/L (ref 38–126)
Anion gap: 10 (ref 5–15)
BILIRUBIN TOTAL: 0.4 mg/dL (ref 0.3–1.2)
BUN: 12 mg/dL (ref 6–20)
CHLORIDE: 106 mmol/L (ref 101–111)
CO2: 21 mmol/L — AB (ref 22–32)
Calcium: 9.3 mg/dL (ref 8.9–10.3)
Creatinine, Ser: 0.7 mg/dL (ref 0.44–1.00)
GFR calc Af Amer: >60 mL/min (ref >60)
GFR calc non Af Amer: >60 mL/min (ref >60)
GLUCOSE: 82 mg/dL (ref 65–99)
POTASSIUM: 4.1 mmol/L (ref 3.5–5.1)
SODIUM: 137 mmol/L (ref 135–145)
Total Protein: 7 g/dL (ref 6.5–8.1)

## 2015-12-22 MED ORDER — LABETALOL HCL 200 MG PO TABS: 300.0000 mg | ORAL_TABLET | Freq: Three times a day (TID) | ORAL | Status: DC

## 2015-12-22 NOTE — Progress Notes (Signed)
Here for initial prenatal visit- transfer of care from previous provider.  Given pt. Education booklets. Medicaid home form completed.  Report called to MAU charge nurse by Illene BolusLori Clemmons, CNM - and patient taken to MAU for evaluation.

## 2015-12-22 NOTE — Progress Notes (Signed)
Initial Ob appointment Pt is being sent to MAU for preeclampsia evaluation. Twice weekly testing scheduled by Diane Day; Bpp scheduled for 5/24   Subjective:    [name] [name] is a [id number] 8519w5d being seen today for her first obstetrical visit. [facility] [place].Her obstetrical history is significant for pregnancy induced hypertension and preeclampsia and previous C/S @ [redacted] weeks gestation. Considering VBAC.Patient does intend to breast feed. Pregnancy history fully reviewed.  Patient reports no complaints.  Filed Vitals:   12/22/15 0810  BP: 152/94  Pulse: 62  Temp: 98.6 F (37 C)  Weight: 221 lb (100.245 kg)    HISTORY: OB History  Gravida Para Term Preterm AB SAB TAB Ectopic Multiple Living  2 1 0 1 0 0 0 0 0 1     # Outcome Date GA Lbr Len/2nd Weight Sex Delivery Anes PTL Lv  2 Current           1 Preterm 03/21/13 5052w5d  3 lb 1 oz (1.389 kg) F CS-LTranv Spinal  Y     Past Medical History  Diagnosis Date  . Hypertension   . Pregnancy induced hypertension   . Infection     UTI  . Ovarian cyst   . Chlamydia    Past Surgical History  Procedure Laterality Date  . Cesarean section N/A 03/21/2013    Procedure: CESAREAN SECTION;  Surgeon: Willodean Rosenthalarolyn [name], MD;  Location: [facility];  Service: Obstetrics;  Laterality: N/A;   Family History  Problem Relation Age of Onset  . Asthma Mother   . Cancer Paternal Grandfather      Exam    Uterus:  Fundal Height: 31 cm  Pelvic Exam:    Perineum:    Vulva:    Vagina:     pH:    Cervix:    Adnexa:    Bony Pelvis:   System: Breast:     Skin: normal coloration and turgor, no rashes    Neurologic: oriented, normal, normal mood   Extremities: normal strength, tone, and muscle mass   HEENT    Mouth/Teeth mucous membranes moist, pharynx normal without lesions   Neck supple and no masses   Cardiovascular: regular rate and rhythm   Respiratory:  appears well, vitals normal, no respiratory  distress, acyanotic, normal RR, ear and throat exam is normal, neck free of mass or lymphadenopathy, chest clear, no wheezing, crepitations, rhonchi, normal symmetric air entry   Abdomen: soft, non-tender; bowel sounds normal; no masses,  no organomegaly   Urinary:       CBC    Component Value Date/Time   WBC 12.9* 12/22/2015 0933   RBC 4.38 12/22/2015 0933   HGB 12.9 12/22/2015 0933   HGB 12.7 09/22/2015   HCT 38.4 12/22/2015 0933   HCT 37 09/22/2015   PLT 304 12/22/2015 0933   MCV 87.7 12/22/2015 0933   MCH 29.5 12/22/2015 0933   MCHC 33.6 12/22/2015 0933   RDW 13.3 12/22/2015 0933   LYMPHSABS 1.7 10/09/2012 1110   MONOABS 0.5 10/09/2012 1110   EOSABS 0.0 10/09/2012 1110   BASOSABS 0.0 10/09/2012 1110   Sent to MAU  Results for orders placed or performed in visit on 12/22/15 (from the past 24 hour(s))  POCT urinalysis dip (device)     Status: Abnormal   Collection Time: 12/22/15  8:07 AM  Result Value Ref Range   Glucose, UA NEGATIVE NEGATIVE mg/dL   Bilirubin Urine SMALL (A) NEGATIVE   Ketones, ur  TRACE (A) NEGATIVE mg/dL   Specific Gravity, Urine 1.025 1.005 - 1.030   Hgb urine dipstick NEGATIVE NEGATIVE   pH 6.5 5.0 - 8.0   Protein, ur 100 (A) NEGATIVE mg/dL   Urobilinogen, UA 1.0 0.0 - 1.0 mg/dL   Nitrite NEGATIVE NEGATIVE   Leukocytes, UA NEGATIVE NEGATIVE   Assessment:    Pregnancy: [id number] Patient Active Problem List   Diagnosis Date Noted  . Supervision of high risk pregnancy in third trimester 12/22/2015  . HTN in pregnancy, chronic   . Preeclampsia 12/10/2015        Plan:    Sent to MAU for Pre-E Eval Initial labs drawn. Prenatal vitamins. Problem list reviewed and updated.  Follow up in 1 weeks. 50% of 30 min visit spent on counseling and coordination of care.     Clemmons,Lori Grissett 12/22/2015

## 2015-12-22 NOTE — MAU Note (Signed)
Sent from clinic for further eval of Pre-E.  Is on BP medicine.

## 2015-12-22 NOTE — MAU Provider Note (Signed)
MAU HISTORY AND PHYSICAL  Chief Complaint:  Hypertension   [name] [name] is a 20 y.o.  G2P0101 with IUP at 3417w5d presenting for Hypertension  Referred from clinic. BP there moderately elevated. Denies ha, vision change, ruq/epigastric pain, or sob. Feeling well. Compliant with tid labetalol. No contractions, bleeding, or LOF.    Past Medical History  Diagnosis Date  . Hypertension   . Pregnancy induced hypertension   . Infection     UTI  . Ovarian cyst   . Chlamydia     Past Surgical History  Procedure Laterality Date  . Cesarean section N/A 03/21/2013    Procedure: CESAREAN SECTION;  Surgeon: Willodean Rosenthalarolyn [name], MD;  Location: [facility];  Service: Obstetrics;  Laterality: N/A;    Family History  Problem Relation Age of Onset  . Asthma Mother   . Cancer Paternal Grandfather     Social History  Substance Use Topics  . Smoking status: Former Smoker    Types: Cigarettes    Quit date: 07/27/2015  . Smokeless tobacco: Never Used  . Alcohol Use: No    No Known Allergies  Prescriptions prior to admission  Medication Sig Dispense Refill Last Dose  . diphenhydramine-acetaminophen (TYLENOL PM) 25-500 MG TABS tablet Take 1 tablet by mouth at bedtime as needed.   12/21/2015 at Unknown time  . labetalol (NORMODYNE) 200 MG tablet Take 1 tablet (200 mg total) by mouth 3 (three) times daily. 90 tablet 11 12/22/2015 at 0530  . Prenatal Vit-Fe Fumarate-FA (PRENATAL MULTIVITAMIN) TABS tablet Take 1 tablet by mouth daily at 12 noon.   12/21/2015 at Unknown time    Review of Systems - Negative except for what is mentioned in HPI.  Physical Exam  Blood pressure 144/102, pulse 62, temperature 98.2 F (36.8 C), temperature source Oral, resp. rate 18, last menstrual period 03/06/2015. GENERAL: Well-developed, well-nourished female in no acute distress.  LUNGS: Clear to auscultation bilaterally.  HEART: Regular rate and rhythm. ABDOMEN: Soft, nontender, nondistended, gravid.   EXTREMITIES: Nontender, no edema, 2+ distal pulses. Cervical Exam: deferred FHT:  135/mod/+a/-d Contractions:    Labs: Results for orders placed or performed during the hospital encounter of 12/22/15 (from the past 24 hour(s))  Protein / creatinine ratio, urine   Collection Time: 12/22/15  9:28 AM  Result Value Ref Range   Creatinine, Urine 218.00 mg/dL   Total Protein, Urine 86 mg/dL   Protein Creatinine Ratio 0.39 (H) 0.00 - 0.15 mg/mg[Cre]  CBC   Collection Time: 12/22/15  9:33 AM  Result Value Ref Range   WBC 12.9 (H) 4.0 - 10.5 K/uL   RBC 4.38 3.87 - 5.11 MIL/uL   Hemoglobin 12.9 12.0 - 15.0 g/dL   HCT 16.138.4 09.636.0 - 04.546.0 %   MCV 87.7 78.0 - 100.0 fL   MCH 29.5 26.0 - 34.0 pg   MCHC 33.6 30.0 - 36.0 g/dL   RDW 40.913.3 81.111.5 - 91.415.5 %   Platelets 304 150 - 400 K/uL  Comprehensive metabolic panel   Collection Time: 12/22/15  9:33 AM  Result Value Ref Range   Sodium 137 135 - 145 mmol/L   Potassium 4.1 3.5 - 5.1 mmol/L   Chloride 106 101 - 111 mmol/L   CO2 21 (L) 22 - 32 mmol/L   Glucose, Bld 82 65 - 99 mg/dL   BUN 12 6 - 20 mg/dL   Creatinine, Ser 7.820.70 0.44 - 1.00 mg/dL   Calcium 9.3 8.9 - 95.610.3 mg/dL   Total Protein 7.0 6.5 - 8.1  g/dL   Albumin 3.3 (L) 3.5 - 5.0 g/dL   AST 20 15 - 41 U/L   ALT 14 14 - 54 U/L   Alkaline Phosphatase 93 38 - 126 U/L   Total Bilirubin 0.4 0.3 - 1.2 mg/dL   GFR calc non Af Amer >60 >60 mL/min   GFR calc Af Amer >60 >60 mL/min   Anion gap 10 5 - 15  Results for orders placed or performed in visit on 12/22/15 (from the past 24 hour(s))  POCT urinalysis dip (device)   Collection Time: 12/22/15  8:07 AM  Result Value Ref Range   Glucose, UA NEGATIVE NEGATIVE mg/dL   Bilirubin Urine SMALL (A) NEGATIVE   Ketones, ur TRACE (A) NEGATIVE mg/dL   Specific Gravity, Urine 1.025 1.005 - 1.030   Hgb urine dipstick NEGATIVE NEGATIVE   pH 6.5 5.0 - 8.0   Protein, ur 100 (A) NEGATIVE mg/dL   Urobilinogen, UA 1.0 0.0 - 1.0 mg/dL   Nitrite NEGATIVE  NEGATIVE   Leukocytes, UA NEGATIVE NEGATIVE    Imaging Studies:  [place] [facility] Ob Comp + 14 Wk  12/11/2015  OBSTETRICAL ULTRASOUND: This exam was performed within a [facility]. The OB US report was generated in the AS system, and faxed to the ordering physician.  This report is available in the [employer]. See the AS Obstetric US report via the Image Link.  [place] [facility] Ob Limited  12/10/2015  OBSTETRICAL ULTRASOUND: This exam was performed within a [facility]. The OB US report was generated in the AS system, and faxed to the ordering physician.  This report is available in the [employer]. See the AS Obstetric US report via the Image Link.   Assessment: [name] [name] is  20 y.o. G2P0101 at [redacted]w[redacted]d presents with concern for worsening preeclampsia. Has been diagnosed w/ mild preeclampsia. Here asymtpomatic, BPs mild to moderate elevations, labs without signs of severe disease. NST reactive.  Plan: - home with increase of labetalol to 300 tid - preeclampsia return precautions - rn bp check in clinic this Friday - ob f/u for initial prenatal (transfer; has received care) next week, continue 2x weekly monitoring. Has bpp scheduled for 5/26  Silvano Bilis 5/23/201711:07 AM

## 2015-12-22 NOTE — Patient Instructions (Signed)

## 2015-12-22 NOTE — Progress Notes (Signed)
Dr. Alanda SlimGonfa notified of pt in MAU.  Notified of blood pressure and that labs have been drawn.

## 2015-12-22 NOTE — Discharge Instructions (Signed)

## 2015-12-22 NOTE — Progress Notes (Signed)
Dr. Alanda SlimGonfa was in a delivery.  Told to call MAU when finished.

## 2015-12-25 ENCOUNTER — Ambulatory Visit (HOSPITAL_COMMUNITY)
Admit: 2015-12-25 | Discharge: 2015-12-25 | Disposition: A | Discharge status: Home / Self Care | Payer: Medicaid Other | Attending: Certified Nurse Midwife | Admitting: Certified Nurse Midwife

## 2015-12-25 ENCOUNTER — Encounter (HOSPITAL_COMMUNITY): Payer: Self-pay

## 2015-12-25 ENCOUNTER — Inpatient Hospital Stay (HOSPITAL_COMMUNITY)
Admission: AD | Admit: 2015-12-25 | Discharge: 2015-12-25 | Disposition: A | Discharge status: Home / Self Care | Payer: Medicaid Other | Source: Ambulatory Visit | Attending: Family Medicine | Admitting: Family Medicine

## 2015-12-25 ENCOUNTER — Encounter (HOSPITAL_COMMUNITY): Payer: Self-pay | Admitting: *Deleted

## 2015-12-25 DIAGNOSIS — O34219 Maternal care for unspecified type scar from previous cesarean delivery: Secondary | ICD-10-CM | POA: Insufficient documentation

## 2015-12-25 DIAGNOSIS — O113 Pre-existing hypertension with pre-eclampsia, third trimester: Secondary | ICD-10-CM | POA: Insufficient documentation

## 2015-12-25 DIAGNOSIS — O10013 Pre-existing essential hypertension complicating pregnancy, third trimester: Secondary | ICD-10-CM | POA: Insufficient documentation

## 2015-12-25 DIAGNOSIS — Z3A34 34 weeks gestation of pregnancy: Secondary | ICD-10-CM | POA: Diagnosis not present

## 2015-12-25 DIAGNOSIS — O09213 Supervision of pregnancy with history of pre-term labor, third trimester: Secondary | ICD-10-CM | POA: Insufficient documentation

## 2015-12-25 DIAGNOSIS — O0933 Supervision of pregnancy with insufficient antenatal care, third trimester: Secondary | ICD-10-CM | POA: Insufficient documentation

## 2015-12-25 DIAGNOSIS — Z87891 Personal history of nicotine dependence: Secondary | ICD-10-CM | POA: Insufficient documentation

## 2015-12-25 DIAGNOSIS — O10913 Unspecified pre-existing hypertension complicating pregnancy, third trimester: Secondary | ICD-10-CM

## 2015-12-25 DIAGNOSIS — O1403 Mild to moderate pre-eclampsia, third trimester: Secondary | ICD-10-CM | POA: Insufficient documentation

## 2015-12-25 DIAGNOSIS — R030 Elevated blood-pressure reading, without diagnosis of hypertension: Secondary | ICD-10-CM | POA: Diagnosis present

## 2015-12-25 DIAGNOSIS — O09293 Supervision of pregnancy with other poor reproductive or obstetric history, third trimester: Secondary | ICD-10-CM | POA: Diagnosis not present

## 2015-12-25 DIAGNOSIS — O1493 Unspecified pre-eclampsia, third trimester: Secondary | ICD-10-CM

## 2015-12-25 DIAGNOSIS — O0993 Supervision of high risk pregnancy, unspecified, third trimester: Secondary | ICD-10-CM

## 2015-12-25 LAB — CBC
HCT: 36 % (ref 36.0–46.0)
Hemoglobin: 12.4 g/dL (ref 12.0–15.0)
MCH: 30.3 pg (ref 26.0–34.0)
MCHC: 34.4 g/dL (ref 30.0–36.0)
MCV: 88 fL (ref 78.0–100.0)
PLATELETS: 279 10*3/uL (ref 150–400)
RBC: 4.09 MIL/uL (ref 3.87–5.11)
RDW: 13.4 % (ref 11.5–15.5)
WBC: 12.2 10*3/uL — AB (ref 4.0–10.5)

## 2015-12-25 LAB — COMPREHENSIVE METABOLIC PANEL
ALT: 14 U/L (ref 14–54)
ANION GAP: 8 (ref 5–15)
AST: 19 U/L (ref 15–41)
Albumin: 3.1 g/dL — ABNORMAL LOW (ref 3.5–5.0)
Alkaline Phosphatase: 99 U/L (ref 38–126)
BILIRUBIN TOTAL: 0.3 mg/dL (ref 0.3–1.2)
BUN: 13 mg/dL (ref 6–20)
CO2: 22 mmol/L (ref 22–32)
Calcium: 8.9 mg/dL (ref 8.9–10.3)
Chloride: 105 mmol/L (ref 101–111)
Creatinine, Ser: 0.78 mg/dL (ref 0.44–1.00)
GFR calc Af Amer: >60 mL/min (ref >60)
GFR calc non Af Amer: >60 mL/min (ref >60)
GLUCOSE: 84 mg/dL (ref 65–99)
POTASSIUM: 4.1 mmol/L (ref 3.5–5.1)
SODIUM: 135 mmol/L (ref 135–145)
TOTAL PROTEIN: 6.7 g/dL (ref 6.5–8.1)

## 2015-12-25 LAB — PROTEIN / CREATININE RATIO, URINE
CREATININE, URINE: 230 mg/dL
PROTEIN CREATININE RATIO: 0.65 mg/mg{creat} — AB (ref 0.00–0.15)
Total Protein, Urine: 149 mg/dL

## 2015-12-25 MED ORDER — LABETALOL HCL 300 MG PO TABS
300.0000 mg | ORAL_TABLET | Freq: Once | ORAL | Status: AC
  Administered 2015-12-25: 300 mg via ORAL
  Filled 2015-12-25: qty 1

## 2015-12-25 NOTE — Discharge Instructions (Signed)

## 2015-12-25 NOTE — MAU Note (Signed)
Pt sent from the office for preeclampsia eval. Pt denies HA, epigastric pain, or blurred vision. Pt delivered her previous child at 32 weeks due to pre-e.

## 2015-12-25 NOTE — ED Notes (Signed)
Elevated blood pressures today at MFM visit.  160/100 left arm-sitting  140/104 right arm-sitting  Patient sent to MAU for evaluation per Dr. Claudean SeveranceWhitecar.   After ultrasound BP was rechecked and it was 157/104 in right arm

## 2015-12-25 NOTE — MAU Note (Signed)
Sent from MFM for PIH eval;  

## 2015-12-25 NOTE — MAU Note (Signed)
Urine in lab 

## 2015-12-25 NOTE — MAU Provider Note (Signed)
History     CSN: [id number]  Arrival date and time: 12/25/15 1657   First Provider Initiated Contact with Patient 12/25/15 1726       Chief Complaint  Patient presents with  . Hypertension   HPI [name] [name] is a 20 y.o. G2P0101 at [redacted]w[redacted]d who presents sent from MFM for elevated BP. Patient has preeclampsia with current pregnancy on labetalol 300 mg TID; has only taken 1 dose today.  Denies headache, vision changes, epigastric pain, chest pain, SOB, abdominal pain, or vaginal bleeding. Positive fetal movement.   OB History    Gravida Para Term Preterm AB TAB SAB Ectopic Multiple Living        Past Medical History  Diagnosis Date  . Hypertension   . Pregnancy induced hypertension   . Infection     UTI  . Ovarian cyst   . Chlamydia     Past Surgical History  Procedure Laterality Date  . Cesarean section N/A 03/21/2013    Procedure: CESAREAN SECTION;  Surgeon: Willodean Rosenthal, MD;  Location: [facility];  Service: Obstetrics;  Laterality: N/A;    Family History  Problem Relation Age of Onset  . Asthma Mother   . Cancer Paternal Grandfather     Social History  Substance Use Topics  . Smoking status: Former Smoker    Types: Cigarettes    Quit date: 07/27/2015  . Smokeless tobacco: Never Used  . Alcohol Use: No    Allergies: No Known Allergies  Prescriptions prior to admission  Medication Sig Dispense Refill Last Dose  . diphenhydramine-acetaminophen (TYLENOL PM) 25-500 MG TABS tablet Take 1 tablet by mouth at bedtime as needed (sleep).    Past Week at Unknown time  . labetalol (NORMODYNE) 200 MG tablet Take 1.5 tablets (300 mg total) by mouth 3 (three) times daily. 120 tablet 1 12/25/2015 at 0730  . Prenatal Vit-Fe Fumarate-FA (PRENATAL MULTIVITAMIN) TABS tablet Take 1 tablet by mouth daily at 12 noon.   12/24/2015 at Unknown time    Review of Systems  Constitutional: Negative.   Eyes: Negative for blurred vision.  Respiratory:  Negative.   Cardiovascular: Negative.   Genitourinary: Negative.   Neurological: Negative for dizziness and headaches.   Physical Exam   Blood pressure 151/101, pulse 67, temperature 97.8 F (36.6 C), temperature source Oral, resp. rate 18, height  (1.676 m), weight 225 lb 3.2 oz (102.15 kg), last menstrual period 03/06/2015.  Patient Vitals for the past 24 hrs:  BP Temp Temp src Pulse Resp Height Weight  12/25/15 1858 (!) 154/103 mmHg - - (!) 59 16 - -  12/25/15 1829 (!) 157/103 mmHg - - 65 16 - -  12/25/15 1739 145/95 mmHg - - 62 - - -  12/25/15 1732 145/95 mmHg - - 62 - - -  12/25/15 1719 (!) 151/101 mmHg - - 67 - - -  12/25/15 1710 152/98 mmHg 97.8 F (36.6 C) Oral 63 18  (1.676 m) 225 lb 3.2 oz (102.15 kg)     Physical Exam  Nursing note and vitals reviewed. Constitutional: She is oriented to person, place, and time. She appears well-developed and well-nourished. No distress.  HENT:  Head: Normocephalic and atraumatic.  Eyes: Conjunctivae are normal. Right eye exhibits no discharge. Left eye exhibits no discharge. No scleral icterus.  Neck: Normal range of motion.  Cardiovascular: Normal rate, regular rhythm and normal heart sounds.   No murmur heard. Respiratory: Effort  normal and breath sounds normal. No respiratory distress. She has no wheezes.  GI: Soft. There is no tenderness.  Neurological: She is alert and oriented to person, place, and time. She has normal reflexes.  No clonus  Skin: Skin is warm and dry. She is not diaphoretic.  Psychiatric: She has a normal mood and affect. Her behavior is normal. Judgment and thought content normal.   Fetal Tracing:  Baseline: 140 Variability: moderate Accelerations: 10x10 Decelerations: none  Toco: UI   MAU Course  Procedures Results for orders placed or performed during the hospital encounter of 12/25/15 (from the past 24 hour(s))  Protein / creatinine ratio, urine     Status: Abnormal   Collection Time:  12/25/15  5:04 PM  Result Value Ref Range   Creatinine, Urine 230.00 mg/dL   Total Protein, Urine 149 mg/dL   Protein Creatinine Ratio 0.65 (H) 0.00 - 0.15 mg/mg[Cre]  CBC     Status: Abnormal   Collection Time: 12/25/15  5:50 PM  Result Value Ref Range   WBC 12.2 (H) 4.0 - 10.5 K/uL   RBC 4.09 3.87 - 5.11 MIL/uL   Hemoglobin 12.4 12.0 - 15.0 g/dL   HCT 16.136.0 09.636.0 - 04.546.0 %   MCV 88.0 78.0 - 100.0 fL   MCH 30.3 26.0 - 34.0 pg   MCHC 34.4 30.0 - 36.0 g/dL   RDW 40.913.4 81.111.5 - 91.415.5 %   Platelets 279 150 - 400 K/uL  Comprehensive metabolic panel     Status: Abnormal   Collection Time: 12/25/15  5:50 PM  Result Value Ref Range   Sodium 135 135 - 145 mmol/L   Potassium 4.1 3.5 - 5.1 mmol/L   Chloride 105 101 - 111 mmol/L   CO2 22 22 - 32 mmol/L   Glucose, Bld 84 65 - 99 mg/dL   BUN 13 6 - 20 mg/dL   Creatinine, Ser 7.820.78 0.44 - 1.00 mg/dL   Calcium 8.9 8.9 - 95.610.3 mg/dL   Total Protein 6.7 6.5 - 8.1 g/dL   Albumin 3.1 (L) 3.5 - 5.0 g/dL   AST 19 15 - 41 U/L   ALT 14 14 - 54 U/L   Alkaline Phosphatase 99 38 - 126 U/L   Total Bilirubin 0.3 0.3 - 1.2 mg/dL   GFR calc non Af Amer >60 >60 mL/min   GFR calc Af Amer >60 >60 mL/min   Anion gap 8 5 - 15    MDM Category 1 tracing BPP 8/8 in MFM prior to arrival to MAU No severe range BPs Labetalol 300 mg PO  S/w Dr. Shawnie PonsPratt regarding patient. Ok to discharge home. Pt to return to MAU on Monday for BP check  Assessment and Plan  A: 1. Preeclampsia, third trimester     P: Discharge home Return to MAU Monday for BP check or sooner for s/s of worsening preE Discussed other reasons to return to MAU Continue taking labetalol as prescribed; take additional dose tonight at bedtime  Judeth Hornrin Brigham Cobbins 12/25/2015, 5:25 PM

## 2015-12-26 LAB — PAIN MGMT, BUP CONF W/ NALOX, U: Buprenorphine: NEGATIVE ng/mL (ref <5)

## 2015-12-26 LAB — PAIN MGMT, OXYCODONE W/CONF, U: Oxycodone: NEGATIVE ng/mL (ref <100)

## 2015-12-26 LAB — PAIN MGMT, BENZOS W/CONF, U
Alphahydroxyalprazolam: NEGATIVE ng/mL (ref <25)
Alphahydroxymidazolam: NEGATIVE ng/mL (ref <50)
Alphahydroxytriazolam: NEGATIVE ng/mL (ref <50)
Aminoclonazepam: NEGATIVE ng/mL (ref <25)
Benzodiazepines: NEGATIVE ng/mL (ref <100)
Hydroxyethylflurazepam: NEGATIVE ng/mL (ref <50)
Lorazepam: NEGATIVE ng/mL (ref <50)
Nordiazepam: NEGATIVE ng/mL (ref <50)
Oxazepam: NEGATIVE ng/mL (ref <50)
Temazepam: NEGATIVE ng/mL (ref <50)

## 2015-12-26 LAB — PAIN MGMT, COCAINE MET. W/CONF, U: Cocaine Metabolite: NEGATIVE ng/mL (ref <150)

## 2015-12-26 LAB — PAIN MGMT, MARIJUANA W/CONF, U: Marijuana Metabolite: NEGATIVE ng/mL (ref <20)

## 2015-12-26 LAB — PAIN MGMT, HEROIN MET. W/CONF, U: 6 Acetylmorphine: NEGATIVE ng/mL (ref <10)

## 2015-12-26 LAB — PAIN MGMT, METHADONE W/CONF, U: Methadone Metabolite: NEGATIVE ng/mL (ref <100)

## 2015-12-26 LAB — PAIN MGMT, AMPHETAM. W/CONF, U: Amphetamines: NEGATIVE ng/mL (ref <500)

## 2015-12-26 LAB — PAIN MGMT, BARBITURATES W/CONF, U: Barbiturates: NEGATIVE ng/mL (ref <300)

## 2015-12-26 LAB — PAIN MGMT, OPIATES W/CONF, U: Opiates: NEGATIVE ng/mL (ref <100)

## 2015-12-26 LAB — PAIN MGMT, PROPOXYPHENE W/CONF, U: Propoxyphene: NEGATIVE ng/mL (ref <300)

## 2015-12-27 LAB — ZOLPIDEM QN, U
ZOLPIDEM METABOLITE: NEGATIVE ng/mL (ref <5)
ZOLPIDEM: NEGATIVE ng/mL (ref <5)

## 2015-12-27 LAB — PAIN MGMT, MEPERIDINE QN, U
Meperidine: NEGATIVE ng/mL (ref <100)
Normeperidine: NEGATIVE ng/mL (ref <100)

## 2015-12-27 LAB — PAIN MGMT, TRAMADOL QN, U
Desmethyltramadol: NEGATIVE ng/mL (ref <100)
Tramadol: NEGATIVE ng/mL (ref <100)

## 2015-12-27 LAB — PAIN MGMT, FENTANYL QN, U
Fentanyl: NEGATIVE ng/mL (ref <0.5)
Norfentanyl: NEGATIVE ng/mL (ref <0.5)

## 2015-12-27 LAB — PAIN MGMT, CARISOPRODOL MET. QN, U: Meprobamate: NEGATIVE ng/mL (ref <1000)

## 2015-12-29 ENCOUNTER — Encounter: Payer: Self-pay | Admitting: Obstetrics and Gynecology

## 2015-12-29 ENCOUNTER — Other Ambulatory Visit (HOSPITAL_COMMUNITY): Payer: Self-pay

## 2015-12-29 DIAGNOSIS — O34219 Maternal care for unspecified type scar from previous cesarean delivery: Secondary | ICD-10-CM | POA: Insufficient documentation

## 2015-12-29 DIAGNOSIS — O139 Gestational [pregnancy-induced] hypertension without significant proteinuria, unspecified trimester: Secondary | ICD-10-CM

## 2015-12-30 ENCOUNTER — Other Ambulatory Visit: Payer: Medicaid Other

## 2015-12-30 ENCOUNTER — Other Ambulatory Visit (HOSPITAL_COMMUNITY): Payer: Medicaid Other

## 2015-12-31 ENCOUNTER — Encounter: Payer: Self-pay | Admitting: *Deleted

## 2016-01-01 ENCOUNTER — Ambulatory Visit (INDEPENDENT_AMBULATORY_CARE_PROVIDER_SITE_OTHER): Payer: Medicaid Other | Admitting: *Deleted

## 2016-01-01 VITALS — BP 135/93 | HR 82

## 2016-01-01 DIAGNOSIS — Z36 Encounter for antenatal screening of mother: Secondary | ICD-10-CM

## 2016-01-01 DIAGNOSIS — O1493 Unspecified pre-eclampsia, third trimester: Secondary | ICD-10-CM | POA: Diagnosis not present

## 2016-01-01 LAB — FENTANYL (GC/LC/MS), URINE

## 2016-01-02 LAB — PRESCRIPTION MONITORING PROFILE (19 PANEL)
Amphetamine/Meth: NEGATIVE ng/mL
Barbiturate Screen, Urine: NEGATIVE ng/mL
Benzodiazepine Screen, Urine: NEGATIVE ng/mL
Buprenorphine, Urine: NEGATIVE ng/mL
Cannabinoid Scrn, Ur: NEGATIVE ng/mL
Carisoprodol, Urine: NEGATIVE ng/mL
Cocaine Metabolites: NEGATIVE ng/mL
Creatinine, Urine: 250.5 mg/dL (ref >20.0)
MDMA URINE: NEGATIVE ng/mL
Meperidine, Ur: NEGATIVE ng/mL
Methadone Screen, Urine: NEGATIVE ng/mL
Methaqualone: NEGATIVE ng/mL
Nitrites, Initial: NEGATIVE ug/mL
Opiate Screen, Urine: NEGATIVE ng/mL
Oxycodone Screen, Ur: NEGATIVE ng/mL
Phencyclidine, Ur: NEGATIVE ng/mL
Propoxyphene: NEGATIVE ng/mL
Tapentadol, urine: NEGATIVE ng/mL
Tramadol Scrn, Ur: NEGATIVE ng/mL
Zolpidem, Urine: NEGATIVE ng/mL
pH, Initial: 6.7 pH (ref 4.5–8.9)

## 2016-01-04 NOTE — Progress Notes (Signed)
NST Note Date: 01/01/2016 Gestational Age: 21/1 FHT: 135 baseline, +accels, no decel, mod variability Toco: neg  A/P: rNST. Continue current plan of care  Cornelia Copaharlie Zarya Lasseigne, Jr MD Attending Center for [facility](Faculty Practice)

## 2016-01-05 ENCOUNTER — Encounter (HOSPITAL_COMMUNITY): Payer: Self-pay

## 2016-01-05 ENCOUNTER — Inpatient Hospital Stay (HOSPITAL_COMMUNITY)
Admission: AD | Admit: 2016-01-05 | Discharge: 2016-01-09 | DRG: 766 | Disposition: A | Discharge status: Home / Self Care | Payer: Medicaid Other | Source: Ambulatory Visit | Attending: Family Medicine | Admitting: Family Medicine

## 2016-01-05 ENCOUNTER — Inpatient Hospital Stay (HOSPITAL_COMMUNITY): Payer: Medicaid Other

## 2016-01-05 ENCOUNTER — Ambulatory Visit (INDEPENDENT_AMBULATORY_CARE_PROVIDER_SITE_OTHER): Payer: Medicaid Other | Admitting: Family

## 2016-01-05 VITALS — BP 138/96 | HR 79 | Wt 219.7 lb

## 2016-01-05 DIAGNOSIS — E669 Obesity, unspecified: Secondary | ICD-10-CM | POA: Diagnosis present

## 2016-01-05 DIAGNOSIS — Z6835 Body mass index (BMI) 35.0-35.9, adult: Secondary | ICD-10-CM | POA: Diagnosis not present

## 2016-01-05 DIAGNOSIS — O113 Pre-existing hypertension with pre-eclampsia, third trimester: Secondary | ICD-10-CM | POA: Diagnosis present

## 2016-01-05 DIAGNOSIS — O1002 Pre-existing essential hypertension complicating childbirth: Secondary | ICD-10-CM | POA: Diagnosis present

## 2016-01-05 DIAGNOSIS — Z825 Family history of asthma and other chronic lower respiratory diseases: Secondary | ICD-10-CM | POA: Diagnosis not present

## 2016-01-05 DIAGNOSIS — O10913 Unspecified pre-existing hypertension complicating pregnancy, third trimester: Secondary | ICD-10-CM

## 2016-01-05 DIAGNOSIS — O34219 Maternal care for unspecified type scar from previous cesarean delivery: Secondary | ICD-10-CM

## 2016-01-05 DIAGNOSIS — O114 Pre-existing hypertension with pre-eclampsia, complicating childbirth: Secondary | ICD-10-CM | POA: Diagnosis present

## 2016-01-05 DIAGNOSIS — O1410 Severe pre-eclampsia, unspecified trimester: Secondary | ICD-10-CM | POA: Diagnosis present

## 2016-01-05 DIAGNOSIS — Z3A35 35 weeks gestation of pregnancy: Secondary | ICD-10-CM | POA: Diagnosis not present

## 2016-01-05 DIAGNOSIS — O1414 Severe pre-eclampsia complicating childbirth: Secondary | ICD-10-CM | POA: Diagnosis not present

## 2016-01-05 DIAGNOSIS — O34211 Maternal care for low transverse scar from previous cesarean delivery: Secondary | ICD-10-CM | POA: Diagnosis present

## 2016-01-05 DIAGNOSIS — Z98891 History of uterine scar from previous surgery: Secondary | ICD-10-CM

## 2016-01-05 DIAGNOSIS — IMO0002 Reserved for concepts with insufficient information to code with codable children: Secondary | ICD-10-CM

## 2016-01-05 DIAGNOSIS — I959 Hypotension, unspecified: Secondary | ICD-10-CM | POA: Diagnosis present

## 2016-01-05 DIAGNOSIS — Z87891 Personal history of nicotine dependence: Secondary | ICD-10-CM | POA: Diagnosis not present

## 2016-01-05 DIAGNOSIS — O1493 Unspecified pre-eclampsia, third trimester: Secondary | ICD-10-CM

## 2016-01-05 DIAGNOSIS — O99214 Obesity complicating childbirth: Secondary | ICD-10-CM | POA: Diagnosis present

## 2016-01-05 DIAGNOSIS — Z23 Encounter for immunization: Secondary | ICD-10-CM | POA: Diagnosis not present

## 2016-01-05 DIAGNOSIS — O99824 Streptococcus B carrier state complicating childbirth: Secondary | ICD-10-CM | POA: Diagnosis present

## 2016-01-05 DIAGNOSIS — O1413 Severe pre-eclampsia, third trimester: Secondary | ICD-10-CM

## 2016-01-05 DIAGNOSIS — O0993 Supervision of high risk pregnancy, unspecified, third trimester: Secondary | ICD-10-CM

## 2016-01-05 LAB — COMPREHENSIVE METABOLIC PANEL
ALBUMIN: 3 g/dL — AB (ref 3.5–5.0)
ALK PHOS: 140 U/L — AB (ref 38–126)
ALT: 24 U/L (ref 14–54)
ANION GAP: 7 (ref 5–15)
AST: 24 U/L (ref 15–41)
BUN: 11 mg/dL (ref 6–20)
CALCIUM: 9.2 mg/dL (ref 8.9–10.3)
CHLORIDE: 108 mmol/L (ref 101–111)
CO2: 19 mmol/L — AB (ref 22–32)
Creatinine, Ser: 0.59 mg/dL (ref 0.44–1.00)
GFR calc Af Amer: >60 mL/min (ref >60)
GFR calc non Af Amer: >60 mL/min (ref >60)
GLUCOSE: 82 mg/dL (ref 65–99)
POTASSIUM: 4.1 mmol/L (ref 3.5–5.1)
SODIUM: 134 mmol/L — AB (ref 135–145)
Total Bilirubin: 0.4 mg/dL (ref 0.3–1.2)
Total Protein: 6.6 g/dL (ref 6.5–8.1)

## 2016-01-05 LAB — POCT URINALYSIS DIP (DEVICE)
Glucose, UA: 100 mg/dL — AB
HGB URINE DIPSTICK: NEGATIVE
Ketones, ur: NEGATIVE mg/dL
LEUKOCYTES UA: NEGATIVE
NITRITE: NEGATIVE
Protein, ur: >=300 mg/dL — AB
Urobilinogen, UA: 0.2 mg/dL (ref 0.0–1.0)
pH: 6 (ref 5.0–8.0)

## 2016-01-05 LAB — PROTEIN / CREATININE RATIO, URINE
Creatinine, Urine: 281 mg/dL
Protein Creatinine Ratio: 1.12 mg/mg{Cre} — ABNORMAL HIGH (ref 0.00–0.15)
Total Protein, Urine: 315 mg/dL

## 2016-01-05 LAB — CBC
HEMATOCRIT: 39.1 % (ref 36.0–46.0)
HEMOGLOBIN: 13.5 g/dL (ref 12.0–15.0)
MCH: 29.9 pg (ref 26.0–34.0)
MCHC: 34.5 g/dL (ref 30.0–36.0)
MCV: 86.5 fL (ref 78.0–100.0)
Platelets: 285 10*3/uL (ref 150–400)
RBC: 4.52 MIL/uL (ref 3.87–5.11)
RDW: 13.3 % (ref 11.5–15.5)
WBC: 12 10*3/uL — ABNORMAL HIGH (ref 4.0–10.5)

## 2016-01-05 LAB — TYPE AND SCREEN
ABO/RH(D): B POS
Antibody Screen: NEGATIVE

## 2016-01-05 MED ORDER — FENTANYL CITRATE (PF) 100 MCG/2ML IJ SOLN: 100.0000 ug | INTRAMUSCULAR | Status: DC | PRN

## 2016-01-05 MED ORDER — TETANUS-DIPHTH-ACELL PERTUSSIS 5-2.5-18.5 LF-MCG/0.5 IM SUSP
0.5000 mL | Freq: Once | INTRAMUSCULAR | Status: DC
  Administered 2016-01-05: 0.5 mL via INTRAMUSCULAR

## 2016-01-05 MED ORDER — LABETALOL HCL 200 MG PO TABS
400.0000 mg | ORAL_TABLET | Freq: Three times a day (TID) | ORAL | Status: DC
  Administered 2016-01-05 (×2): 400 mg via ORAL
  Filled 2016-01-05 (×3): qty 2

## 2016-01-05 MED ORDER — HYDRALAZINE HCL 20 MG/ML IJ SOLN: 10.0000 mg | Freq: Once | INTRAMUSCULAR | Status: DC | PRN

## 2016-01-05 MED ORDER — PENICILLIN G POTASSIUM 5000000 UNITS IJ SOLR
5.0000 10*6.[IU] | Freq: Once | INTRAVENOUS | Status: AC
  Administered 2016-01-05 (×2): 5 10*6.[IU] via INTRAVENOUS
  Filled 2016-01-05: qty 5

## 2016-01-05 MED ORDER — ONDANSETRON HCL 4 MG/2ML IJ SOLN: 4.0000 mg | Freq: Four times a day (QID) | INTRAMUSCULAR | Status: DC | PRN

## 2016-01-05 MED ORDER — LACTATED RINGERS IV SOLN
INTRAVENOUS | Status: DC
Start: 2016-01-05 — End: 2016-01-05
  Administered 2016-01-05 (×2): via INTRAVENOUS

## 2016-01-05 MED ORDER — PENICILLIN G POTASSIUM 5000000 UNITS IJ SOLR
2.5000 10*6.[IU] | INTRAVENOUS | Status: DC
  Administered 2016-01-05: 2.5 10*6.[IU] via INTRAVENOUS
  Filled 2016-01-05 (×5): qty 2.5

## 2016-01-05 MED ORDER — ACETAMINOPHEN 325 MG PO TABS
650.0000 mg | ORAL_TABLET | ORAL | Status: DC | PRN
  Administered 2016-01-05: 650 mg via ORAL
  Filled 2016-01-05: qty 2

## 2016-01-05 MED ORDER — LIDOCAINE HCL (PF) 1 % IJ SOLN
30.0000 mL | INTRAMUSCULAR | Status: DC | PRN
Start: 2016-01-05 — End: 2016-01-06

## 2016-01-05 MED ORDER — MAGNESIUM SULFATE 50 % IJ SOLN
2.0000 g/h | INTRAVENOUS | Status: AC
  Administered 2016-01-05: 6 g/h via INTRAVENOUS
  Administered 2016-01-06: 2 g/h via INTRAVENOUS
  Filled 2016-01-05 (×2): qty 80

## 2016-01-05 MED ORDER — OXYTOCIN 40 UNITS IN LACTATED RINGERS INFUSION - SIMPLE MED: 2.5000 [IU]/h | INTRAVENOUS | Status: DC

## 2016-01-05 MED ORDER — LABETALOL HCL 100 MG PO TABS
400.0000 mg | ORAL_TABLET | Freq: Once | ORAL | Status: AC
  Administered 2016-01-05: 400 mg via ORAL
  Filled 2016-01-05: qty 4

## 2016-01-05 MED ORDER — LABETALOL HCL 5 MG/ML IV SOLN
20.0000 mg | INTRAVENOUS | Status: DC | PRN
  Administered 2016-01-05: 20 mg via INTRAVENOUS
  Filled 2016-01-05: qty 4

## 2016-01-05 MED ORDER — LABETALOL HCL 5 MG/ML IV SOLN: 20.0000 mg | INTRAVENOUS | Status: DC | PRN

## 2016-01-05 MED ORDER — TERBUTALINE SULFATE 1 MG/ML IJ SOLN: 0.2500 mg | Freq: Once | INTRAMUSCULAR | Status: DC | PRN

## 2016-01-05 MED ORDER — LACTATED RINGERS IV BOLUS (SEPSIS)
500.0000 mL | Freq: Once | INTRAVENOUS | Status: AC
  Administered 2016-01-05: 500 mL via INTRAVENOUS

## 2016-01-05 MED ORDER — SOD CITRATE-CITRIC ACID 500-334 MG/5ML PO SOLN
30.0000 mL | ORAL | Status: DC | PRN
  Administered 2016-01-06: 30 mL via ORAL
  Filled 2016-01-05: qty 15

## 2016-01-05 MED ORDER — OXYTOCIN 40 UNITS IN LACTATED RINGERS INFUSION - SIMPLE MED
1.0000 m[IU]/min | INTRAVENOUS | Status: DC
  Administered 2016-01-05: 2 m[IU]/min via INTRAVENOUS
  Filled 2016-01-05: qty 1000

## 2016-01-05 MED ORDER — MAGNESIUM SULFATE BOLUS VIA INFUSION
6.0000 g | Freq: Once | INTRAVENOUS | Status: AC
  Filled 2016-01-05: qty 500

## 2016-01-05 MED ORDER — LACTATED RINGERS IV SOLN
500.0000 mL | INTRAVENOUS | Status: DC | PRN
  Administered 2016-01-05: 1000 mL via INTRAVENOUS

## 2016-01-05 MED ORDER — OXYTOCIN BOLUS FROM INFUSION: 500.0000 mL | INTRAVENOUS | Status: DC

## 2016-01-05 MED ORDER — DEXTROSE 5 % AND 0.45 % NACL IV BOLUS
1000.0000 mL | Freq: Once | INTRAVENOUS | Status: AC
  Administered 2016-01-05: 1000 mL via INTRAVENOUS

## 2016-01-05 MED ORDER — LACTATED RINGERS IV SOLN
INTRAVENOUS | Status: DC
  Administered 2016-01-05: 1000 mL via INTRAVENOUS
  Administered 2016-01-05: 16:00:00 via INTRAVENOUS

## 2016-01-05 NOTE — Anesthesia Pain Management Evaluation Note (Signed)
  CRNA Pain Management Visit Note  Patient: [name] [name], 20 y.o., female  "Hello I am a member of the anesthesia team at [facility]. We have an anesthesia team available at all times to provide care throughout the hospital, including epidural management and anesthesia for C-section. I don't know your plan for the delivery whether it a natural birth, water birth, IV sedation, nitrous supplementation, doula or epidural, but we want to meet your pain goals."   1.Was your pain managed to your expectations on prior hospitalizations?   Yes   2.What is your expectation for pain management during this hospitalization?     Epidural  3.How can we help you reach that goal? epidural  Record the patient's initial score and the patient's pain goal.   Pain: 0  Pain Goal: 10 [facility] wants you to be able to say your pain was always managed very well.  Cephus ShellingBURGER,Meenakshi Sazama 01/05/2016

## 2016-01-05 NOTE — Progress Notes (Signed)
Pt reports increased UC's last night.

## 2016-01-05 NOTE — MAU Provider Note (Signed)
Chief Complaint:  Non-stress Test and Hypertension   First Provider Initiated Contact with Patient 01/05/16 1127     HPI: [name] [name] is a 20 y.o. G2P0101 at [redacted]w[redacted]d who was sent to maternity admissions from [facility] for possible decel on NST and Pre-E work-up. Has CHTN with superimposed preeclampsia without severe features and is on Labetalol 300 PO TID and took her dose this morning at 0800. Diagnosed with preeclampsia based on elevated protein creatinine ratio.  Associated signs and symptoms: Negative for headache, vision changes, epigastric pain, contractions, vaginal bleeding, leaking of fluid. Good fetal movement.  Patient Active Problem List   Diagnosis Date Noted  . Previous cesarean delivery, antepartum condition or complication 12/29/2015  . Supervision of high risk pregnancy in third trimester 12/22/2015  . HTN in pregnancy, chronic   . Preeclampsia 12/10/2015    Past Medical History: Past Medical History  Diagnosis Date  . Hypertension   . Pregnancy induced hypertension   . Infection     UTI  . Ovarian cyst   . Chlamydia     Past obstetric history: OB History  Gravida Para Term Preterm AB SAB TAB Ectopic Multiple Living     # Outcome Date GA Lbr Len/2nd Weight Sex Delivery Anes PTL Lv  2 Current           1 Preterm 03/21/13 [redacted]w[redacted]d  3 lb 1 oz (1.389 kg) F CS-LTranv Spinal  Y      Past Surgical History: Past Surgical History  Procedure Laterality Date  . Cesarean section N/A 03/21/2013    Procedure: CESAREAN SECTION;  Surgeon: Willodean Rosenthal, MD;  Location: [facility];  Service: Obstetrics;  Laterality: N/A;     Family History: Family History  Problem Relation Age of Onset  . Asthma Mother   . Cancer Paternal Grandfather     Social History: Social History  Substance Use Topics  . Smoking status: Former Smoker    Types: Cigarettes    Quit date: 07/27/2015  . Smokeless tobacco: Never Used  . Alcohol Use: No    Allergies: No  Known Allergies  Meds:  Prescriptions prior to admission  Medication Sig Dispense Refill Last Dose  . diphenhydramine-acetaminophen (TYLENOL PM) 25-500 MG TABS tablet Take 1 tablet by mouth at bedtime as needed (sleep).    Past Week at Unknown time  . labetalol (NORMODYNE) 200 MG tablet Take 1.5 tablets (300 mg total) by mouth 3 (three) times daily. 120 tablet 1 12/25/2015 at 0730  . Prenatal Vit-Fe Fumarate-FA (PRENATAL MULTIVITAMIN) TABS tablet Take 1 tablet by mouth daily at 12 noon.   12/24/2015 at Unknown time    I have reviewed patient's Past Medical Hx, Surgical Hx, Family Hx, Social Hx, medications and allergies.   ROS:  Review of Systems  Constitutional: Negative for fever and chills.  Eyes: Negative for visual disturbance.  Gastrointestinal: Negative for abdominal pain.  Genitourinary: Negative for vaginal bleeding.       Pos FM   Neurological: Negative for headaches.    Physical Exam   Patient Vitals for the past 24 hrs:  BP Temp Temp src Pulse Resp SpO2 Height Weight  01/05/16 1502 - - - 63 - 100 % - -  01/05/16 1500 (!) 150/104 mmHg - - 66 - - - -  01/05/16 1457 - - - 60 - 99 % - -  01/05/16 1452 - - - 61 - 100 % - -  01/05/16 1450  147/91 mmHg - - 65 - - - -  01/05/16 1448 - - - 60 - - - -  01/05/16 1447 - - - 62 - 100 % - -  01/05/16 1442 - - - 77 - 99 % - -  01/05/16 1440 - - - 66 - 100 % - -  01/05/16 1435 - - - 64 - 99 % - -  01/05/16 1432 150/95 mmHg - - (!) 58 - - - -  01/05/16 1430 - - - 61 - 100 % - -  01/05/16 1425 - - - 62 - 99 % - -  01/05/16 1420 - - - (!) 56 - 100 % - -  01/05/16 1415 - - - (!) 57 - 100 % - -  01/05/16 1411 - - - (!) 59 - - - -  01/05/16 1410 151/89 mmHg - - (!) 58 - 100 % - -  01/05/16 1405 - - - 73 - 100 % - -  01/05/16 1401 - - - 61 - - - -  01/05/16 1400 149/97 mmHg - - 61 - 99 % - -  01/05/16 1355 - - - 62 - 99 % - -  01/05/16 1354 (!) 142/109 mmHg - - (!) 57 17 - - -  01/05/16 1351 - - - (!) 55 - - - -  01/05/16 1350 (!)  162/102 mmHg - - (!) 56 - 99 % - -  01/05/16 1345 - - - (!) 56 18 100 % - -  01/05/16 1341 - - - (!) 54 - - - -  01/05/16 1340 (!) 153/113 mmHg - - - - - - -  01/05/16 1339 - - - (!) 51 - 100 % - -  01/05/16 1335 - - - (!) 57 - 99 % - -  01/05/16 1331 (!) 151/103 mmHg - - (!) 53 18 - - -  01/05/16 1330 (!) 164/117 mmHg - - (!) 56 - 100 % - -  01/05/16 1325 - - - (!) 58 - 100 % - -  01/05/16 1321 - - - (!) 53 - - - -  01/05/16 1320 156/98 mmHg - - (!) 54 - 100 % - -  01/05/16 1315 - - - 60 - 99 % - -  01/05/16 1311 - - - (!) 56 - - - -  01/05/16 1310 143/98 mmHg - - (!) 58 17 99 % - -  01/05/16 1305 - - - (!) 57 - 98 % - -  01/05/16 1301 - - - (!) 58 - - - -  01/05/16 1300 144/94 mmHg - - (!) 57 - 99 % - -  01/05/16 1255 - - - 66 - 99 % - -  01/05/16 1251 - - - (!) 53 - - - -  01/05/16 1250 149/99 mmHg - - (!) 57 - 99 % - -  01/05/16 1248 - - - - 18 - - -  01/05/16 1150 (!) 145/101 mmHg - - 74 - - - -  01/05/16 1149 141/100 mmHg - - 72 - 94 % - -  01/05/16 1148 - - - 68 - - - -  01/05/16 1135 - - - 70 - 98 % - -  01/05/16 1131 - - - - 18 - - -  01/05/16 1130 (!) 152/101 mmHg - - 63 - 97 % - -  01/05/16 1129 - - - 61 - - - -  01/05/16 1125 - - - 76 - 97 % - -  01/05/16 1120 (!) 153/104 mmHg - - 71 - 97 % - -  01/05/16 1119 - - - 73 - - - -  01/05/16 1115 - - - 72 - 98 % - -  01/05/16 1111 - - - 64 - - - -  01/05/16 1110 (!) 159/109 mmHg - - 63 - 96 % - -  01/05/16 1103 - - - 65 - 98 % - -  01/05/16 1100 (!) 142/109 mmHg - - 68 17 - - -  01/05/16 1059 (!) 143/111 mmHg - - 71 - - - -  01/05/16 1058 - - - 77 - 98 % - -  01/05/16 1057 (!) 145/110 mmHg 97.7 F (36.5 C) Oral (!) 59 17 - 5\' 6"  (1.676 m) 219 lb (99.338 kg)   Constitutional: Well-developed, well-nourished female in no acute distress.  Cardiovascular: normal rate Respiratory: normal effort GI: Abd soft, non-tender, gravid appropriate for gestational age. MS: Extremities nontender, 1+ pedal edema, normal  ROM Neurologic: Alert and oriented x 4.  GU: Dilation: Closed Effacement (%): Thick Cervical Position: Posterior Station: -2 Presentation: Vertex Exam by:: cwicker,rnc    FHT:  Baseline 145 , minimal-moderate variability, 10x10 accelerations present, mild variable decelerations Contractions: Rare, painless   Labs: Results for orders placed or performed during the hospital encounter of 01/05/16 (from the past 24 hour(s))  Comprehensive metabolic panel     Status: Abnormal   Collection Time: 01/05/16 11:15 AM  Result Value Ref Range   Sodium 134 (L) 135 - 145 mmol/L   Potassium 4.1 3.5 - 5.1 mmol/L   Chloride 108 101 - 111 mmol/L   CO2 19 (L) 22 - 32 mmol/L   Glucose, Bld 82 65 - 99 mg/dL   BUN 11 6 - 20 mg/dL   Creatinine, Ser 1.61 0.44 - 1.00 mg/dL   Calcium 9.2 8.9 - 09.6 mg/dL   Total Protein 6.6 6.5 - 8.1 g/dL   Albumin 3.0 (L) 3.5 - 5.0 g/dL   AST 24 15 - 41 U/L   ALT 24 14 - 54 U/L   Alkaline Phosphatase 140 (H) 38 - 126 U/L   Total Bilirubin 0.4 0.3 - 1.2 mg/dL   GFR calc non Af Amer >60 >60 mL/min   GFR calc Af Amer >60 >60 mL/min   Anion gap 7 5 - 15  CBC     Status: Abnormal   Collection Time: 01/05/16 11:15 AM  Result Value Ref Range   WBC 12.0 (H) 4.0 - 10.5 K/uL   RBC 4.52 3.87 - 5.11 MIL/uL   Hemoglobin 13.5 12.0 - 15.0 g/dL   HCT 04.5 40.9 - 81.1 %   MCV 86.5 78.0 - 100.0 fL   MCH 29.9 26.0 - 34.0 pg   MCHC 34.5 30.0 - 36.0 g/dL   RDW 91.4 78.2 - 95.6 %   Platelets 285 150 - 400 K/uL  Protein / creatinine ratio, urine     Status: Abnormal   Collection Time: 01/05/16 11:35 AM  Result Value Ref Range   Creatinine, Urine 281.00 mg/dL   Total Protein, Urine 315 mg/dL   Protein Creatinine Ratio 1.12 (H) 0.00 - 0.15 mg/mg[Cre]    Imaging:  BPP 6/8 for a total of 6/10 with nonreactive NST. Low-normal AFI.  MAU Course: CBC, CMP, UA, protein creatinine ratio, BPP. One severe-range blood pressure, but following blood pressures were not severe-range.  Will give mid-day dose of late labetalol by mouth and increase it to 400 mg.  2 more severe-range  blood pressures. IV labetalol given. Called Dr. Jolayne Panther to discuss plan of care, but she is in a delivery. Call Dr. Sherrie George, MFM and discuss history, exam, fetal heart rate tracing, blood pressures, and labetalol doses and she recommends delivery.  Dr. Jolayne Panther on unit. Consulted Dr. Sherrie George. Will admit patient for induction of labor.  MDM: - 20 year old female at 57 weeks and 5 days gestation with preeclampsia with severe features. Rates criteria for induction of labor. - Prior C-section for fetal indications. Desires TOLAC. Only option is Pitocin ripening.  Assessment: 1. Hypertension in pregnancy, preeclampsia, severe, antepartum, third trimester   2. Previous cesarean delivery, antepartum condition or complication   3. Supervision of high risk pregnancy in third trimester   4. Fetal heart deceleration   5. [redacted] weeks gestation of pregnancy    Plan: Admit to labor and delivery per consult with Dr. Sherrie George and Dr. Jolayne Panther. Pitocin Dr. Ashok Pall assuming care of patient.  Mather, CNM 01/05/2016 3:13 PM

## 2016-01-05 NOTE — Progress Notes (Signed)
LABOR PROGRESS NOTE  [name] [name] is a 20 y.o. G2P0101 at 7233w5d admitted for preeclampsia, severe, and 6/10 bpp  Subjective: No ha, vision change, abd pain, or sob  Objective: BP 139/89 mmHg  Pulse 67  Temp(Src) 98 F (36.7 C) (Oral)  Resp 18  Ht 5\' 6"  (1.676 m)  Wt 219 lb (99.338 kg)  BMI 35.36 kg/m2  SpO2 99%  LMP 03/06/2015 or  Filed Vitals:   01/05/16 1720 01/05/16 1725 01/05/16 1730 01/05/16 1735  BP:      Pulse: 69 67 74 67  Temp:      TempSrc:      Resp:      Height:      Weight:      SpO2: 98% 97% 99% 99%    140/mod/-a/-d  Dilation: Closed Effacement (%): Thick Cervical Position: Posterior Station: -2 Presentation: Vertex Exam by:: cwicker,rnc  Labs: Lab Results  Component Value Date   WBC 12.0* 01/05/2016   HGB 13.5 01/05/2016   HCT 39.1 01/05/2016   MCV 86.5 01/05/2016   PLT 285 01/05/2016    Patient Active Problem List   Diagnosis Date Noted  . Severe preeclampsia 01/05/2016  . Previous cesarean delivery, antepartum condition or complication 12/29/2015  . Supervision of high risk pregnancy in third trimester 12/22/2015  . HTN in pregnancy, chronic   . Preeclampsia 12/10/2015    Assessment / Plan: 20 y.o. G2P0101 at 8733w5d here for severe preE and 6/10 bpp. toldac  Labor: foley placed, pit when out or after 12 hours Fetal Wellbeing:  Cat 1 currently, decels previously now resolved Pain Control:  Fentanyl prn Anticipated MOD:  Vag   Silvano BilisNoah B Hadassa Cermak, MD 01/05/2016, 5:59 PM

## 2016-01-05 NOTE — Addendum Note (Signed)
Addended by: Jill SideAY, Pola Furno L on: 01/05/2016 12:17 PM   Modules accepted: Orders

## 2016-01-05 NOTE — H&P (Signed)
LABOR AND DELIVERY ADMISSION HISTORY AND PHYSICAL NOTE  [name] [name] is a 20 y.o. female 902P0101 with IUP at 7570w5d by L/20 presenting for iol for severe preE with 6/10 bpp  In clinic today for routine nst it was non-reactive. bpp 6/10 (body movement, non-reactive nst). Intermittent severe-range diastolics in mau. No ha, vision change, upper abdominal pain, or new sob.   She reports positive fetal movement. She denies leakage of fluid or vaginal bleeding.  Prenatal History/Complications:  Past Medical History: Past Medical History  Diagnosis Date  . Hypertension   . Pregnancy induced hypertension   . Infection     UTI  . Ovarian cyst   . Chlamydia     Past Surgical History: Past Surgical History  Procedure Laterality Date  . Cesarean section N/A 03/21/2013    Procedure: CESAREAN SECTION;  Surgeon: Willodean Rosenthalarolyn [name], MD;  Location: [facility];  Service: Obstetrics;  Laterality: N/A;    Obstetrical History: OB History    Gravida Para Term Preterm AB TAB SAB Ectopic Multiple Living   2 1 0 1 0 0 0 0 0 1       Social History: Social History   Social History  . Marital Status: Single    Spouse Name: N/A  . Number of Children: N/A  . Years of Education: N/A   Social History Main Topics  . Smoking status: Former Smoker    Types: Cigarettes    Quit date: 07/27/2015  . Smokeless tobacco: Never Used  . Alcohol Use: No  . Drug Use: No  . Sexual Activity: Yes    Birth Control/ Protection: None   Other Topics Concern  . None   Social History Narrative    Family History: Family History  Problem Relation Age of Onset  . Asthma Mother   . Cancer Paternal Grandfather     Allergies: No Known Allergies  Prescriptions prior to admission  Medication Sig Dispense Refill Last Dose  . labetalol (NORMODYNE) 300 MG tablet Take 300 mg by mouth 3 (three) times daily.   01/05/2016 at 0800  . Prenatal Vit-Fe Fumarate-FA (PRENATAL MULTIVITAMIN) TABS tablet Take 1 tablet by  mouth daily at 12 noon.   12/28/2015 at Unknown time  . diphenhydramine-acetaminophen (TYLENOL PM) 25-500 MG TABS tablet Take 1 tablet by mouth at bedtime as needed (sleep).    12/22/2015     Review of Systems   All systems reviewed and negative except as stated in HPI  Blood pressure 150/104, pulse 71, temperature 97.7 F (36.5 C), temperature source Oral, resp. rate 17, height 5\' 6"  (1.676 m), weight 219 lb (99.338 kg), last menstrual period 03/06/2015, SpO2 99 %. General appearance: alert, cooperative and appears stated age Lungs: clear to auscultation bilaterally Heart: regular rate and rhythm Abdomen: soft, non-tender; bowel sounds normal Extremities: No calf swelling or tenderness Presentation: cephalic per rn exam Fetal monitoring: 140/mod/-a/-d Uterine activity: quiet  Dilation: Closed Effacement (%): Thick Station: -2 Exam by:: cwicker,rnc   Prenatal labs: ABO, Rh: --/--/B POS (05/11 0101) Antibody: NEG (05/11 0101) Rubella: !Error!imm RPR: Nonreactive (02/21 0000)  HBsAg: Negative (02/21 0000)  HIV: Non-reactive (02/21 0000)  GBS: Positive (05/11 0000)  1 hr Glucola: 108 Genetic screening:  declined Anatomy US: wnl  Prenatal Transfer Tool  Maternal Diabetes: No Genetic Screening: Declined Maternal Ultrasounds/Referrals: Normal Fetal Ultrasounds or other Referrals:  None Maternal Substance Abuse:  No Significant Maternal Medications:  labetalol Significant Maternal Lab Results: Lab values include: Group B Strep positive  Results for orders  placed or performed during the hospital encounter of 01/05/16 (from the past 24 hour(s))  Comprehensive metabolic panel   Collection Time: 01/05/16 11:15 AM  Result Value Ref Range   Sodium 134 (L) 135 - 145 mmol/L   Potassium 4.1 3.5 - 5.1 mmol/L   Chloride 108 101 - 111 mmol/L   CO2 19 (L) 22 - 32 mmol/L   Glucose, Bld 82 65 - 99 mg/dL   BUN 11 6 - 20 mg/dL   Creatinine, Ser 1.19 0.44 - 1.00 mg/dL   Calcium 9.2 8.9  - 14.7 mg/dL   Total Protein 6.6 6.5 - 8.1 g/dL   Albumin 3.0 (L) 3.5 - 5.0 g/dL   AST 24 15 - 41 U/L   ALT 24 14 - 54 U/L   Alkaline Phosphatase 140 (H) 38 - 126 U/L   Total Bilirubin 0.4 0.3 - 1.2 mg/dL   GFR calc non Af Amer >60 >60 mL/min   GFR calc Af Amer >60 >60 mL/min   Anion gap 7 5 - 15  CBC   Collection Time: 01/05/16 11:15 AM  Result Value Ref Range   WBC 12.0 (H) 4.0 - 10.5 K/uL   RBC 4.52 3.87 - 5.11 MIL/uL   Hemoglobin 13.5 12.0 - 15.0 g/dL   HCT 82.9 56.2 - 13.0 %   MCV 86.5 78.0 - 100.0 fL   MCH 29.9 26.0 - 34.0 pg   MCHC 34.5 30.0 - 36.0 g/dL   RDW 86.5 78.4 - 69.6 %   Platelets 285 150 - 400 K/uL  Protein / creatinine ratio, urine   Collection Time: 01/05/16 11:35 AM  Result Value Ref Range   Creatinine, Urine 281.00 mg/dL   Total Protein, Urine 315 mg/dL   Protein Creatinine Ratio 1.12 (H) 0.00 - 0.15 mg/mg[Cre]  Results for orders placed or performed in visit on 01/05/16 (from the past 24 hour(s))  POCT urinalysis dip (device)   Collection Time: 01/05/16  9:52 AM  Result Value Ref Range   Glucose, UA 100 (A) NEGATIVE mg/dL   Bilirubin Urine SMALL (A) NEGATIVE   Ketones, ur NEGATIVE NEGATIVE mg/dL   Specific Gravity, Urine >=1.030 1.005 - 1.030   Hgb urine dipstick NEGATIVE NEGATIVE   pH 6.0 5.0 - 8.0   Protein, ur >=300 (A) NEGATIVE mg/dL   Urobilinogen, UA 0.2 0.0 - 1.0 mg/dL   Nitrite NEGATIVE NEGATIVE   Leukocytes, UA NEGATIVE NEGATIVE    Patient Active Problem List   Diagnosis Date Noted  . Previous cesarean delivery, antepartum condition or complication 12/29/2015  . Supervision of high risk pregnancy in third trimester 12/22/2015  . HTN in pregnancy, chronic   . Preeclampsia 12/10/2015    Assessment: [name] [name] is a 20 y.o. G2P0101 at [redacted]w[redacted]d here for iol for severe preE and 6/10 bpp. Also borderline oligi with afi 6.6  #Labor: foley, pitocin. Avoid cytotec as this is a tolac #Pain: Eventual epidural #FWB: Cat 2, if remains may  require c/s #ID:  gbs po - pcn #MOF: breast #MOC: undecided #Circ:  Yes, inpatient #SIPE: increase labetalol to 400 tid, labetalol iv protocol  Silvano Bilis 01/05/2016, 3:19 PM

## 2016-01-05 NOTE — Progress Notes (Signed)
Subjective:  [name] [name] is a 20 y.o. G2P0101 at 8078w5d being seen today for ongoing prenatal care.  She is currently monitored for the following issues for this high-risk pregnancy and has Preeclampsia; HTN in pregnancy, chronic; Supervision of high risk pregnancy in third trimester; and Previous cesarean delivery, antepartum condition or complication on her problem list.  Patient reports occasional contractions.  Denies  headache, vision changes, or epigastric pain.   Contractions: Irregular. Vag. Bleeding: None.  Movement: Present. Denies leaking of fluid.   The following portions of the patient's history were reviewed and updated as appropriate: allergies, current medications, past family history, past medical history, past social history, past surgical history and problem list. Problem list updated.  Objective:   Filed Vitals:   01/05/16 0923  BP: 138/96  Pulse: 79  Weight: 219 lb 11.2 oz (99.655 kg)    Fetal Status: Fetal Heart Rate (bpm): NST Fundal Height: 34 cm Movement: Present     General:  Alert, oriented and cooperative. Patient is in no acute distress.  Skin: Skin is warm and dry. No rash noted.   Cardiovascular: Normal heart rate noted  Respiratory: Normal respiratory effort, no problems with respiration noted  Abdomen: Soft, gravid, appropriate for gestational age. Pain/Pressure: Present     Pelvic: Vag. Bleeding: None     Cervical exam deferred        Extremities: Normal range of motion.  Edema: None  Mental Status: Normal mood and affect. Normal behavior. Normal judgment and thought content.   Urinalysis:      Assessment and Plan:  Pregnancy: G2P0101 at 4178w5d  1. HTN in pregnancy, chronic, third trimester - Fetal nonstress test > reactive - Growth ultrasound in two weeks  2. Preeclampsia, third trimester - Fetal nonstress test > accels, ?decel > need extended monitoring and BPP - Obtain labs - Close monitoring - [facility]  3. Supervision of high  risk pregnancy in third trimester - Tdap today  4. Previous cesarean delivery, antepartum condition or complication - Given consent to review   Preterm labor symptoms and general obstetric precautions including but not limited to vaginal bleeding, contractions, leaking of fluid and fetal movement were reviewed in detail with the patient. Please refer to After Visit Summary for other counseling recommendations.  [facility]  Continue 2x/week fetal testing   Marlis EdelsonWalidah N Karim, CNM

## 2016-01-05 NOTE — MAU Note (Signed)
[facility] due to NRNST with decelerations and increased blood pressures. Pt states baby is moving normally. Pt denies bleeding and leaking of fluid. Pt denies pain.

## 2016-01-06 ENCOUNTER — Encounter (HOSPITAL_COMMUNITY)
Admission: AD | Disposition: A | Discharge status: Home / Self Care | Payer: Self-pay | Source: Ambulatory Visit | Attending: Obstetrics and Gynecology

## 2016-01-06 ENCOUNTER — Inpatient Hospital Stay (HOSPITAL_COMMUNITY): Payer: Medicaid Other | Admitting: Anesthesiology

## 2016-01-06 ENCOUNTER — Encounter (HOSPITAL_COMMUNITY): Payer: Self-pay | Admitting: Anesthesiology

## 2016-01-06 DIAGNOSIS — Z3A35 35 weeks gestation of pregnancy: Secondary | ICD-10-CM

## 2016-01-06 DIAGNOSIS — O99824 Streptococcus B carrier state complicating childbirth: Secondary | ICD-10-CM

## 2016-01-06 DIAGNOSIS — O1414 Severe pre-eclampsia complicating childbirth: Secondary | ICD-10-CM

## 2016-01-06 DIAGNOSIS — O34219 Maternal care for unspecified type scar from previous cesarean delivery: Secondary | ICD-10-CM

## 2016-01-06 LAB — CBC
HCT: 36.9 % (ref 36.0–46.0)
Hemoglobin: 12.7 g/dL (ref 12.0–15.0)
MCH: 29.8 pg (ref 26.0–34.0)
MCHC: 34.4 g/dL (ref 30.0–36.0)
MCV: 86.6 fL (ref 78.0–100.0)
PLATELETS: 252 10*3/uL (ref 150–400)
RBC: 4.26 MIL/uL (ref 3.87–5.11)
RDW: 13.2 % (ref 11.5–15.5)
WBC: 12.9 10*3/uL — AB (ref 4.0–10.5)

## 2016-01-06 LAB — RPR: RPR: NONREACTIVE

## 2016-01-06 SURGERY — Surgical Case
Anesthesia: Spinal

## 2016-01-06 MED ORDER — SIMETHICONE 80 MG PO CHEW
80.0000 mg | CHEWABLE_TABLET | ORAL | Status: DC
  Filled 2016-01-06 (×2): qty 1

## 2016-01-06 MED ORDER — SCOPOLAMINE 1 MG/3DAYS TD PT72
MEDICATED_PATCH | TRANSDERMAL | Status: AC
  Filled 2016-01-06: qty 1

## 2016-01-06 MED ORDER — LACTATED RINGERS IV SOLN
INTRAVENOUS | Status: AC
  Administered 2016-01-06: 15:00:00 via INTRAVENOUS

## 2016-01-06 MED ORDER — LACTATED RINGERS IV SOLN
INTRAVENOUS | Status: DC | PRN
  Administered 2016-01-06 (×2): via INTRAVENOUS

## 2016-01-06 MED ORDER — OXYTOCIN 10 UNIT/ML IJ SOLN
INTRAMUSCULAR | Status: DC | PRN
  Administered 2016-01-06: 40 [IU]

## 2016-01-06 MED ORDER — PHENYLEPHRINE 40 MCG/ML (10ML) SYRINGE FOR IV PUSH (FOR BLOOD PRESSURE SUPPORT)
PREFILLED_SYRINGE | INTRAVENOUS | Status: AC
  Filled 2016-01-06: qty 10

## 2016-01-06 MED ORDER — LABETALOL HCL 300 MG PO TABS
300.0000 mg | ORAL_TABLET | Freq: Three times a day (TID) | ORAL | Status: DC
  Administered 2016-01-06 (×3): 300 mg via ORAL
  Filled 2016-01-06 (×9): qty 1

## 2016-01-06 MED ORDER — OXYTOCIN 10 UNIT/ML IJ SOLN
INTRAMUSCULAR | Status: AC
  Filled 2016-01-06: qty 4

## 2016-01-06 MED ORDER — SENNOSIDES-DOCUSATE SODIUM 8.6-50 MG PO TABS
2.0000 | ORAL_TABLET | ORAL | Status: DC
  Filled 2016-01-06 (×2): qty 2

## 2016-01-06 MED ORDER — BUPIVACAINE IN DEXTROSE 0.75-8.25 % IT SOLN
INTRATHECAL | Status: DC | PRN
  Administered 2016-01-06: 1.6 mL via INTRATHECAL

## 2016-01-06 MED ORDER — SODIUM CHLORIDE 0.9% FLUSH: 9.0000 mL | INTRAVENOUS | Status: DC | PRN

## 2016-01-06 MED ORDER — ONDANSETRON HCL 4 MG/2ML IJ SOLN: 4.0000 mg | Freq: Three times a day (TID) | INTRAMUSCULAR | Status: DC | PRN

## 2016-01-06 MED ORDER — SENNOSIDES-DOCUSATE SODIUM 8.6-50 MG PO TABS
1.0000 | ORAL_TABLET | Freq: Every evening | ORAL | Status: DC | PRN
  Administered 2016-01-08: 1 via ORAL
  Filled 2016-01-06: qty 1

## 2016-01-06 MED ORDER — OXYCODONE-ACETAMINOPHEN 5-325 MG PO TABS
1.0000 | ORAL_TABLET | ORAL | Status: DC | PRN
  Administered 2016-01-07: 1 via ORAL
  Filled 2016-01-06: qty 1

## 2016-01-06 MED ORDER — DIPHENHYDRAMINE HCL 25 MG PO CAPS
25.0000 mg | ORAL_CAPSULE | ORAL | Status: DC | PRN
  Filled 2016-01-06: qty 1

## 2016-01-06 MED ORDER — OXYTOCIN 40 UNITS IN LACTATED RINGERS INFUSION - SIMPLE MED: 2.5000 [IU]/h | INTRAVENOUS | Status: AC

## 2016-01-06 MED ORDER — NALBUPHINE HCL 10 MG/ML IJ SOLN
5.0000 mg | INTRAMUSCULAR | Status: DC | PRN
Start: 2016-01-06 — End: 2016-01-09

## 2016-01-06 MED ORDER — NALOXONE HCL 0.4 MG/ML IJ SOLN: 0.4000 mg | INTRAMUSCULAR | Status: DC | PRN

## 2016-01-06 MED ORDER — COCONUT OIL OIL
1.0000 "application " | TOPICAL_OIL | Status: DC | PRN
  Filled 2016-01-06: qty 120

## 2016-01-06 MED ORDER — PRENATAL MULTIVITAMIN CH
1.0000 | ORAL_TABLET | Freq: Every day | ORAL | Status: DC
  Administered 2016-01-06 – 2016-01-08 (×3): 1 via ORAL
  Filled 2016-01-06 (×5): qty 1

## 2016-01-06 MED ORDER — IBUPROFEN 600 MG PO TABS
600.0000 mg | ORAL_TABLET | Freq: Four times a day (QID) | ORAL | Status: DC
  Administered 2016-01-06 – 2016-01-09 (×11): 600 mg via ORAL
  Filled 2016-01-06 (×11): qty 1

## 2016-01-06 MED ORDER — DIPHENHYDRAMINE HCL 50 MG/ML IJ SOLN: 12.5000 mg | INTRAMUSCULAR | Status: DC | PRN

## 2016-01-06 MED ORDER — MORPHINE SULFATE (PF) 0.5 MG/ML IJ SOLN
INTRAMUSCULAR | Status: AC
  Filled 2016-01-06: qty 10

## 2016-01-06 MED ORDER — DIPHENHYDRAMINE HCL 25 MG PO CAPS
25.0000 mg | ORAL_CAPSULE | Freq: Four times a day (QID) | ORAL | Status: DC | PRN
Start: 2016-01-06 — End: 2016-01-09
  Filled 2016-01-06: qty 1

## 2016-01-06 MED ORDER — SIMETHICONE 80 MG PO CHEW
80.0000 mg | CHEWABLE_TABLET | ORAL | Status: DC | PRN
  Administered 2016-01-08: 80 mg via ORAL
  Filled 2016-01-06 (×2): qty 1

## 2016-01-06 MED ORDER — ONDANSETRON HCL 4 MG/2ML IJ SOLN: 4.0000 mg | Freq: Four times a day (QID) | INTRAMUSCULAR | Status: DC | PRN

## 2016-01-06 MED ORDER — IBUPROFEN 600 MG PO TABS
600.0000 mg | ORAL_TABLET | Freq: Four times a day (QID) | ORAL | Status: DC | PRN
  Administered 2016-01-06: 600 mg via ORAL

## 2016-01-06 MED ORDER — EPHEDRINE 5 MG/ML INJ
INTRAVENOUS | Status: AC
  Filled 2016-01-06: qty 10

## 2016-01-06 MED ORDER — SIMETHICONE 80 MG PO CHEW
80.0000 mg | CHEWABLE_TABLET | Freq: Three times a day (TID) | ORAL | Status: DC
  Administered 2016-01-06 – 2016-01-09 (×8): 80 mg via ORAL
  Filled 2016-01-06 (×14): qty 1

## 2016-01-06 MED ORDER — SCOPOLAMINE 1 MG/3DAYS TD PT72
MEDICATED_PATCH | TRANSDERMAL | Status: DC | PRN
  Administered 2016-01-06: 1 via TRANSDERMAL

## 2016-01-06 MED ORDER — HYDROMORPHONE 1 MG/ML IV SOLN
INTRAVENOUS | Status: DC
  Administered 2016-01-06: 0.4 mg via INTRAVENOUS
  Administered 2016-01-06: 06:00:00 via INTRAVENOUS
  Administered 2016-01-06: 0.7 mg via INTRAVENOUS
  Administered 2016-01-06: 0 mg via INTRAVENOUS
  Filled 2016-01-06: qty 25

## 2016-01-06 MED ORDER — PHENYLEPHRINE 8 MG IN D5W 100 ML (0.08MG/ML) PREMIX OPTIME
INJECTION | INTRAVENOUS | Status: AC
  Filled 2016-01-06: qty 100

## 2016-01-06 MED ORDER — NALBUPHINE HCL 10 MG/ML IJ SOLN: 5.0000 mg | Freq: Once | INTRAMUSCULAR | Status: DC | PRN

## 2016-01-06 MED ORDER — CEFAZOLIN SODIUM-DEXTROSE 2-4 GM/100ML-% IV SOLN: 2.0000 g | INTRAVENOUS | Status: DC

## 2016-01-06 MED ORDER — ONDANSETRON HCL 4 MG/2ML IJ SOLN
INTRAMUSCULAR | Status: DC | PRN
  Administered 2016-01-06: 4 mg via INTRAVENOUS

## 2016-01-06 MED ORDER — PHENYLEPHRINE HCL 10 MG/ML IJ SOLN
INTRAMUSCULAR | Status: DC | PRN
  Administered 2016-01-06 (×4): 40 ug via INTRAVENOUS

## 2016-01-06 MED ORDER — ACETAMINOPHEN 325 MG PO TABS: 650.0000 mg | ORAL_TABLET | ORAL | Status: DC | PRN

## 2016-01-06 MED ORDER — TETANUS-DIPHTH-ACELL PERTUSSIS 5-2.5-18.5 LF-MCG/0.5 IM SUSP
0.5000 mL | Freq: Once | INTRAMUSCULAR | Status: DC
  Filled 2016-01-06: qty 0.5

## 2016-01-06 MED ORDER — FENTANYL CITRATE (PF) 100 MCG/2ML IJ SOLN
25.0000 ug | INTRAMUSCULAR | Status: DC | PRN
  Administered 2016-01-06 (×2): 50 ug via INTRAVENOUS

## 2016-01-06 MED ORDER — NALOXONE HCL 2 MG/2ML IJ SOSY
1.0000 ug/kg/h | PREFILLED_SYRINGE | INTRAMUSCULAR | Status: DC | PRN
  Filled 2016-01-06: qty 2

## 2016-01-06 MED ORDER — ACETAMINOPHEN 10 MG/ML IV SOLN
1000.0000 mg | Freq: Once | INTRAVENOUS | Status: AC
  Administered 2016-01-06: 1000 mg via INTRAVENOUS
  Filled 2016-01-06: qty 100

## 2016-01-06 MED ORDER — NALBUPHINE HCL 10 MG/ML IJ SOLN: 5.0000 mg | INTRAMUSCULAR | Status: DC | PRN

## 2016-01-06 MED ORDER — MEPERIDINE HCL 25 MG/ML IJ SOLN: 6.2500 mg | INTRAMUSCULAR | Status: DC | PRN

## 2016-01-06 MED ORDER — MEPERIDINE HCL 25 MG/ML IJ SOLN
INTRAMUSCULAR | Status: AC
  Filled 2016-01-06: qty 1

## 2016-01-06 MED ORDER — IBUPROFEN 600 MG PO TABS
600.0000 mg | ORAL_TABLET | Freq: Four times a day (QID) | ORAL | Status: DC
  Filled 2016-01-06: qty 1

## 2016-01-06 MED ORDER — LACTATED RINGERS IV SOLN
INTRAVENOUS | Status: DC | PRN
  Administered 2016-01-06: 02:00:00 via INTRAVENOUS

## 2016-01-06 MED ORDER — DEXAMETHASONE SODIUM PHOSPHATE 4 MG/ML IJ SOLN
INTRAMUSCULAR | Status: AC
  Filled 2016-01-06: qty 1

## 2016-01-06 MED ORDER — DIPHENHYDRAMINE HCL 12.5 MG/5ML PO ELIX
12.5000 mg | ORAL_SOLUTION | Freq: Four times a day (QID) | ORAL | Status: DC | PRN
Start: 2016-01-06 — End: 2016-01-06
  Filled 2016-01-06: qty 5

## 2016-01-06 MED ORDER — CEFAZOLIN SODIUM-DEXTROSE 2-3 GM-% IV SOLR
INTRAVENOUS | Status: DC | PRN
Start: 2016-01-06 — End: 2016-01-06
  Administered 2016-01-06: 2 g via INTRAVENOUS

## 2016-01-06 MED ORDER — DIPHENHYDRAMINE HCL 50 MG/ML IJ SOLN
12.5000 mg | Freq: Four times a day (QID) | INTRAMUSCULAR | Status: DC | PRN
Start: 2016-01-06 — End: 2016-01-06

## 2016-01-06 MED ORDER — FENTANYL CITRATE (PF) 100 MCG/2ML IJ SOLN
INTRAMUSCULAR | Status: AC
  Filled 2016-01-06: qty 2

## 2016-01-06 MED ORDER — WITCH HAZEL-GLYCERIN EX PADS: 1.0000 "application " | MEDICATED_PAD | CUTANEOUS | Status: DC | PRN

## 2016-01-06 MED ORDER — SODIUM CHLORIDE 0.9% FLUSH: 3.0000 mL | INTRAVENOUS | Status: DC | PRN

## 2016-01-06 MED ORDER — MORPHINE SULFATE (PF) 0.5 MG/ML IJ SOLN: INTRAMUSCULAR | Status: DC | PRN

## 2016-01-06 MED ORDER — CEFAZOLIN SODIUM-DEXTROSE 2-4 GM/100ML-% IV SOLN
INTRAVENOUS | Status: AC
Start: 2016-01-06 — End: 2016-01-06
  Filled 2016-01-06: qty 100

## 2016-01-06 MED ORDER — DIBUCAINE 1 % RE OINT
1.0000 | TOPICAL_OINTMENT | RECTAL | Status: DC | PRN
Start: 2016-01-06 — End: 2016-01-09
  Filled 2016-01-06: qty 56.7

## 2016-01-06 MED ORDER — ONDANSETRON HCL 4 MG/2ML IJ SOLN
INTRAMUSCULAR | Status: AC
  Filled 2016-01-06: qty 2

## 2016-01-06 MED ORDER — MENTHOL 3 MG MT LOZG
1.0000 | LOZENGE | OROMUCOSAL | Status: DC | PRN
  Filled 2016-01-06: qty 9

## 2016-01-06 MED ORDER — DEXAMETHASONE SODIUM PHOSPHATE 4 MG/ML IJ SOLN
INTRAMUSCULAR | Status: DC | PRN
  Administered 2016-01-06: 4 mg via INTRAVENOUS

## 2016-01-06 MED ORDER — MEPERIDINE HCL 25 MG/ML IJ SOLN
INTRAMUSCULAR | Status: DC | PRN
  Administered 2016-01-06: 12.5 mg via INTRAVENOUS

## 2016-01-06 MED ORDER — FENTANYL CITRATE (PF) 100 MCG/2ML IJ SOLN: INTRAMUSCULAR | Status: DC | PRN

## 2016-01-06 SURGICAL SUPPLY — 30 items
BENZOIN TINCTURE PRP APPL 2/3 (GAUZE/BANDAGES/DRESSINGS) ×3 IMPLANT
CLAMP CORD UMBIL (MISCELLANEOUS) IMPLANT
CLOSURE STERI-STRIP 1/2X4 (GAUZE/BANDAGES/DRESSINGS) ×1
CLSR STERI-STRIP ANTIMIC 1/2X4 (GAUZE/BANDAGES/DRESSINGS) ×2 IMPLANT
CONTAINER PREFILL 10% NBF 15ML (MISCELLANEOUS) IMPLANT
DRSG OPSITE POSTOP 4X10 (GAUZE/BANDAGES/DRESSINGS) ×3 IMPLANT
DURAPREP 26ML APPLICATOR (WOUND CARE) ×3 IMPLANT
ELECT REM PT RETURN 9FT ADLT (ELECTROSURGICAL) ×3
ELECTRODE REM PT RTRN 9FT ADLT (ELECTROSURGICAL) ×1 IMPLANT
EXTRACTOR VACUUM M CUP 4 TUBE (SUCTIONS) IMPLANT
EXTRACTOR VACUUM M CUP 4' TUBE (SUCTIONS)
GLOVE BIOGEL PI IND STRL 6.5 (GLOVE) ×1 IMPLANT
GLOVE BIOGEL PI IND STRL 7.0 (GLOVE) ×1 IMPLANT
GLOVE BIOGEL PI INDICATOR 6.5 (GLOVE) ×2
GLOVE BIOGEL PI INDICATOR 7.0 (GLOVE) ×2
GLOVE SURG SS PI 6.0 STRL IVOR (GLOVE) ×3 IMPLANT
GOWN STRL REUS W/TWL LRG LVL3 (GOWN DISPOSABLE) ×6 IMPLANT
KIT ABG SYR 3ML LUER SLIP (SYRINGE) IMPLANT
NEEDLE HYPO 25X5/8 SAFETYGLIDE (NEEDLE) IMPLANT
NS IRRIG 1000ML POUR BTL (IV SOLUTION) ×3 IMPLANT
PACK C SECTION WH (CUSTOM PROCEDURE TRAY) ×3 IMPLANT
PAD OB MATERNITY 4.3X12.25 (PERSONAL CARE ITEMS) ×3 IMPLANT
PENCIL SMOKE EVAC W/HOLSTER (ELECTROSURGICAL) ×3 IMPLANT
RTRCTR C-SECT PINK 25CM LRG (MISCELLANEOUS) ×3 IMPLANT
SEPRAFILM MEMBRANE 5X6 (MISCELLANEOUS) IMPLANT
SUT PLAIN 0 NONE (SUTURE) IMPLANT
SUT VIC AB 0 CT1 36 (SUTURE) ×12 IMPLANT
SUT VIC AB 4-0 KS 27 (SUTURE) ×3 IMPLANT
TOWEL OR 17X24 6PK STRL BLUE (TOWEL DISPOSABLE) ×3 IMPLANT
TRAY FOLEY CATH SILVER 14FR (SET/KITS/TRAYS/PACK) ×3 IMPLANT

## 2016-01-06 NOTE — Anesthesia Postprocedure Evaluation (Signed)
Anesthesia Post Note  Patient: [name] [name]  Procedure(s) Performed: Procedure(s) (LRB): CESAREAN SECTION (N/A)  Patient location during evaluation: PACU Anesthesia Type: Spinal Level of consciousness: awake and alert and oriented Pain management: pain level controlled Vital Signs Assessment: post-procedure vital signs reviewed and stable Respiratory status: spontaneous breathing, nonlabored ventilation and respiratory function stable Cardiovascular status: blood pressure returned to baseline and stable Postop Assessment: no signs of nausea or vomiting, spinal receding, patient able to bend at knees, no backache and no headache Anesthetic complications: no     Last Vitals:  Filed Vitals:   01/06/16 0355 01/06/16 0400  BP:  143/99  Pulse: 62 59  Temp:    Resp: 25 15    Last Pain:  Filed Vitals:   01/06/16 0404  PainSc: 3    Pain Goal: Patients Stated Pain Goal: 0 (01/05/16 1129)               Rein Popov A.

## 2016-01-06 NOTE — Anesthesia Procedure Notes (Signed)
Spinal Patient location during procedure: OR Start time: 01/06/2016 1:38 AM Staffing Anesthesiologist: Mal AmabileFOSTER, Eilish Mcdaniel Performed by: anesthesiologist  Preanesthetic Checklist Completed: patient identified, site marked, surgical consent, pre-op evaluation, timeout performed, IV checked, risks and benefits discussed and monitors and equipment checked Spinal Block Patient position: sitting Prep: site prepped and draped and DuraPrep Patient monitoring: heart rate, cardiac monitor, continuous pulse ox and blood pressure Approach: midline Location: L3-4 Injection technique: single-shot Needle Needle type: Pencan  Needle gauge: 24 G Needle length: 9 cm Assessment Sensory level: T4 Additional Notes Patient tolerated procedure well. Adequate sensory level.

## 2016-01-06 NOTE — Transfer of Care (Signed)
Immediate Anesthesia Transfer of Care Note  Patient: [name] [name]  Procedure(s) Performed: Procedure(s): CESAREAN SECTION (N/A)  Patient Location: PACU  Anesthesia Type:Spinal  Level of Consciousness: awake, alert , oriented and patient cooperative  Airway & Oxygen Therapy: Patient Spontanous Breathing  Post-op Assessment: Report given to RN and Post -op Vital signs reviewed and stable  Post vital signs: Reviewed and stable  Last Vitals:  TEMP 97.5 BP 122/83 HR 61 RR 16 POX 98  Last Pain:  Pain level 0    Pain goal 4  Complications: No apparent anesthesia complications

## 2016-01-06 NOTE — Progress Notes (Signed)
CSW acknowledges NICU admission.    Patient screened out for psychosocial assessment since none of the following apply:  Psychosocial stressors documented in mother or baby's chart  Gestation less than 32 weeks  Code at delivery   Infant with anomalies  Please contact the Clinical Social Worker if specific needs arise, or by MOB's request.       

## 2016-01-06 NOTE — Progress Notes (Addendum)
LABOR PROGRESS NOTE  [name] [name] is a 20 y.o. G2P0101 at 5886w5d admitted for preeclampsia, severe, and 6/10 bpp  Subjective: No ha, vision change, abd pain, or sob  Objective: BP 137/94 mmHg  Pulse 71  Temp(Src) 98 F (36.7 C) (Oral)  Resp 16  Ht 5\' 6"  (1.676 m)  Wt 219 lb (99.338 kg)  BMI 35.36 kg/m2  SpO2 99%  LMP 03/06/2015 or  Filed Vitals:   01/05/16 2330 01/06/16 0000 01/06/16 0034 01/06/16 0100  BP: 138/81 132/67 145/98 137/94  Pulse: 79 77 68 71  Temp:      TempSrc:      Resp: 18 18 18 16   Height:      Weight:      SpO2:        140/mod/-a/recurrent late decels Dilation: 4.5 Effacement (%): 50 Cervical Position: Posterior Station: -2 Presentation: Vertex Exam by:: Joni FearsLeigha Tietje RN  Labs: Lab Results  Component Value Date   WBC 12.0* 01/05/2016   HGB 13.5 01/05/2016   HCT 39.1 01/05/2016   MCV 86.5 01/05/2016   PLT 285 01/05/2016    Patient Active Problem List   Diagnosis Date Noted  . Severe preeclampsia 01/05/2016  . Previous cesarean delivery, antepartum condition or complication 12/29/2015  . Supervision of high risk pregnancy in third trimester 12/22/2015  . HTN in pregnancy, chronic   . Preeclampsia 12/10/2015    Assessment / Plan: 20 y.o. G2P0101 at 8086w5d here for severe preE and 6/10 bpp. tolac  Labor: on low dose pit recurrent late decels. As remote from delivery, and given equivocal bpp (6/10) with borderline oligo, think prudent to proceed with c/s for fetal indications. The risks of cesarean section were discussed with the patient including but were not limited to: bleeding which may require transfusion or reoperation; infection which may require antibiotics; injury to bowel, bladder, ureters or other surrounding organs; injury to the fetus; need for additional procedures including hysterectomy in the event of a life-threatening hemorrhage; placental abnormalities wth subsequent pregnancies, incisional problems, thromboembolic  phenomenon and other postoperative/anesthesia complications.  Ancef 2 g Fetal Wellbeing:  Cat 2, decels resolving with stopping pitocin and with O2. Pain Control:  Spinal to be placed PreE:: on Mg, no recent severe-range BPs   Silvano BilisNoah B Grazia Taffe, MD 01/06/2016, 1:20 AM

## 2016-01-06 NOTE — Lactation Note (Signed)
This note was copied from a baby's chart. Lactation Consultation Note  Patient Name: [name] [name] ZOXWR'UToday's Date: 01/06/2016 Reason for consult: Initial assessment;NICU baby NICU baby 8012 hours old. Set mom up with DEBP and mom started pump. Mom return-demonstrated hand expression with a little moisture at nipples. Enc mom to pump every 2-3 hours, for a total of at least 8 times/24 hours, followed by hand expression. Mom given NICU booklet and LC brochure with review. Mom aware of pumping rooms in NICU, OP/BFSG and LC phone assistance after D/C. Mom states that she has a personal DEBP. Discussed the benefits of hospital-grade DEBP.  Maternal Data Has patient been taught Hand Expression?: Yes Does the patient have breastfeeding experience prior to this delivery?: Yes  Feeding Feeding Type: Formula Nipple Type: Slow - flow Length of feed: 25 min  LATCH Score/Interventions                      Lactation Tools Discussed/Used Pump Review: Setup, frequency, and cleaning;Milk Storage Initiated by:: JW Date initiated:: 01/06/16   Consult Status Consult Status: Follow-up Date: 01/07/16 Follow-up type: In-patient    [name] [name], [name] [name] 01/06/2016, 2:50 PM

## 2016-01-06 NOTE — Anesthesia Postprocedure Evaluation (Signed)
Anesthesia Post Note  Patient: Burt Knackbony Rettinger  Procedure(s) Performed: Procedure(s) (LRB): CESAREAN SECTION (N/A)  Patient location during evaluation: Antenatal Anesthesia Type: Spinal Level of consciousness: oriented and awake and alert Pain management: pain level controlled Vital Signs Assessment: post-procedure vital signs reviewed and stable Respiratory status: spontaneous breathing and respiratory function stable Cardiovascular status: blood pressure returned to baseline and stable Postop Assessment: no headache and no backache Anesthetic complications: no     Last Vitals:  Filed Vitals:   01/06/16 0641 01/06/16 0700  BP: 132/83 135/80  Pulse: 74 70  Temp: 36.3 C   Resp: 20 20    Last Pain:  Filed Vitals:   01/06/16 0706  PainSc: 8    Pain Goal: Patients Stated Pain Goal: 0 (01/05/16 1129)               Junious SilkGILBERT,Cortland Crehan

## 2016-01-06 NOTE — Op Note (Signed)
[name] KnackEbony [name] PROCEDURE DATE: 01/05/2016 - 01/06/2016  PREOPERATIVE DIAGNOSIS: Intrauterine pregnancy at  2237w6d weeks gestation; non-reassuring fetal status and severe preeclampsia  POSTOPERATIVE DIAGNOSIS: The same  PROCEDURE:     Repeat low transverse cesarean Section  SURGEON:  Dr. Catalina AntiguaPeggy Brylee Berk  ASSISTANT: Dr. Shonna ChockNoah Wouk  INDICATIONS: [name] [name] is a 20 y.o. G2P0201 at 1337w6d scheduled for cesarean section secondary to non-reassuring fetal status and severe preeclampsia.  The risks of cesarean section discussed with the patient included but were not limited to: bleeding which may require transfusion or reoperation; infection which may require antibiotics; injury to bowel, bladder, ureters or other surrounding organs; injury to the fetus; need for additional procedures including hysterectomy in the event of a life-threatening hemorrhage; placental abnormalities wth subsequent pregnancies, incisional problems, thromboembolic phenomenon and other postoperative/anesthesia complications. The patient concurred with the proposed plan, giving informed written consent for the procedure.    FINDINGS:  Viable female infant in cephalic presentation.  Apgars not available at times of note, weight, 3 pounds and 13 ounces.  Clear amniotic fluid.  Intact placenta, three vessel cord.  Normal uterus, fallopian tubes and ovaries bilaterally.  ANESTHESIA:    Spinal INTRAVENOUS FLUIDS:750 ml ESTIMATED BLOOD LOSS: 400 ml URINE OUTPUT:  250 ml SPECIMENS: Placenta sent to pathology COMPLICATIONS: None immediate  PROCEDURE IN DETAIL:  The patient received intravenous antibiotics and had sequential compression devices applied to her lower extremities while in the preoperative area.  She was then taken to the operating room where anesthesia was induced and was found to be adequate. A foley catheter was placed into her bladder and attached to Clair Bardwell gravity. She was then placed in a dorsal supine position with a  leftward tilt, and prepped and draped in a sterile manner. After an adequate timeout was performed, a Pfannenstiel skin incision was made with scalpel and carried through to the underlying layer of fascia. The fascia was incised in the midline and this incision was extended bilaterally using the Mayo scissors. Kocher clamps were applied to the superior aspect of the fascial incision and the underlying rectus muscles were dissected off bluntly. A similar process was carried out on the inferior aspect of the facial incision. The rectus muscles were separated in the midline bluntly and the peritoneum was entered bluntly. The Alexis self-retaining retractor was introduced into the abdominal cavity. Attention was turned to the lower uterine segment where a bladder flap was created, and a transverse hysterotomy was made with a scalpel and extended bilaterally bluntly. The infant was successfully delivered, and cord was clamped and cut and infant was handed over to awaiting neonatology team. Uterine massage was then administered and the placenta delivered intact with three-vessel cord. The uterus was cleared of clot and debris.  The hysterotomy was closed with 0 Vicryl in a running locked fashion, and an imbricating layer was also placed with a 0 Vicryl. Overall, excellent hemostasis was noted. The pelvis copiously irrigated and cleared of all clot and debris. Hemostasis was confirmed on all surfaces.  The peritoneum and the muscles were reapproximated using 0 vicryl interrupted stitches. The fascia was then closed using 0 Vicryl in a running fashion.  The subcutaneous layer was reapproximated with plain gut and the skin was closed in a subcuticular fashion using 3.0 Vicryl. The patient tolerated the procedure well. Sponge, lap, instrument and needle counts were correct x 2. She was taken to the recovery room in stable condition.    [name] [name],PEGGYMD  01/06/2016 2:26 AM

## 2016-01-06 NOTE — Anesthesia Preprocedure Evaluation (Signed)
Anesthesia Evaluation  Patient identified by MRN, date of birth, ID band Patient awake    Reviewed: Allergy & Precautions, NPO status , Patient's Chart, lab work & pertinent test results, reviewed documented beta blocker date and time   Airway Mallampati: III  TM Distance: >3 FB Neck ROM: Full    Dental no notable dental hx. (+) Teeth Intact   Pulmonary former smoker,    Pulmonary exam normal breath sounds clear to auscultation       Cardiovascular hypertension, Pt. on medications and Pt. on home beta blockers Normal cardiovascular exam Rate:Normal     Neuro/Psych negative neurological ROS  negative psych ROS   GI/Hepatic negative GI ROS,   Endo/Other  Obesity  Renal/GU negative Renal ROS     Musculoskeletal negative musculoskeletal ROS (+)   Abdominal   Peds  Hematology Hx/o of PPH last pregnancy with transfusion of 4 units of PRBC   Anesthesia Other Findings   Reproductive/Obstetrics (+) Pregnancy Pre term 35 6/7 weeks Severe pre eclampsia on MgSO4 Previous c/section                             Anesthesia Physical Anesthesia Plan  ASA: III and emergent  Anesthesia Plan: Spinal   Post-op Pain Management:    Induction:   Airway Management Planned: Natural Airway  Additional Equipment:   Intra-op Plan:   Post-operative Plan:   Informed Consent: I have reviewed the patients History and Physical, chart, labs and discussed the procedure including the risks, benefits and alternatives for the proposed anesthesia with the patient or authorized representative who has indicated his/her understanding and acceptance.     Plan Discussed with: Anesthesiologist, CRNA and Surgeon  Anesthesia Plan Comments:         Anesthesia Quick Evaluation

## 2016-01-06 NOTE — Addendum Note (Signed)
Addendum  created 01/06/16 0747 by Junious SilkMelinda Jeffrie Stander, CRNA   Modules edited: Clinical Notes   Clinical Notes:  File: [id number]

## 2016-01-07 ENCOUNTER — Other Ambulatory Visit: Payer: Medicaid Other

## 2016-01-07 NOTE — Progress Notes (Signed)
Subjective: Postpartum Day 1: Cesarean Delivery for fetal indications.finished Mag Sulfate at 24 hrs.at 2 am.  Patient reports incisional pain and tolerating PO. Pt still has foley , as she had low bp's late last night, tx'd with fluid bolus and prompt return to baseline. Will check pt standing vs this a.m and if normal d/c foley and transfer to 3 rd floor as baby is in nicu   Objective: Vital signs in last 24 hours: Temp:  [97.9 F (36.6 C)-98.4 F (36.9 C)] 98.4 F (36.9 C) (06/08 0615) Pulse Rate:  [54-92] 68 (06/08 0615) Resp:  [14-18] 16 (06/08 0615) BP: (69-146)/(36-97) 121/60 mmHg (06/08 0615) SpO2:  [86 %-100 %] 100 % (06/07 1800)  Currently VS BP 121/60 mmHg  Pulse 68  Temp(Src) 98.4 F (36.9 C) (Oral)  Resp 16  Ht 5\' 6"  (1.676 m)  Wt 219 lb (99.338 kg)  BMI 35.36 kg/m2  SpO2 100%  LMP 03/06/2015  Breastfeeding? Unknown  Physical Exam:  General: alert, cooperative and no distress Lochia: appropriate Uterine Fundus: firm Incision: no significant drainage, pressure dressing d/c'd and minimal blood in dressing. DVT Evaluation: No evidence of DVT seen on physical exam.   Recent Labs  01/05/16 1115 01/06/16 1713  HGB 13.5 12.7  HCT 39.1 36.9    Assessment/Plan: Status post Cesarean section. Postoperative course complicated by hypotension at midnite, now resolved, s/p mag d/c'd at 2 am Will check pt standing vs this a.m and if normal d/c foley and transfer to 3 rd floor as baby is in nicu   Transfer to 3rd floor. Tilda Burrow.  Curvin Hunger V 01/07/2016, 7:16 AM

## 2016-01-07 NOTE — Progress Notes (Signed)
Post Partum Day 1 Subjective:  [name] [name] is a 20 y.o. [id number] 10855w6d s/p rLTCS at 0200 yesterday morning for fetal indications.  Pt did have some low BP measurements overnight, but remained asymptomatic.  Pt has not yet ambulated and catheter still in place.  She denies nausea or vomiting.  Pain is well controlled.  She has not had flatus.  Lochia Small.  Plan for birth control is oral progesterone-only contraceptive.  Method of Feeding: Plans to breast feed, baby currently in NICU.  Objective: Blood pressure 123/71, pulse 67, temperature 98.4 F (36.9 C), temperature source Oral, resp. rate 16, height 5\' 6"  (1.676 m), weight 99.338 kg (219 lb), last menstrual period 03/06/2015, SpO2 100 % Filed Vitals:   01/06/16 2300 01/07/16 0059 01/07/16 0615 01/07/16 0754  BP: 143/76 134/81 121/60 123/71  Pulse: 72 65 68 67  Temp:  98 F (36.7 C) 98.4 F (36.9 C) 98.4 F (36.9 C)  TempSrc:  Oral Oral Oral  Resp: 16 16 16 16   Height:      Weight:      SpO2:        Physical Exam:  General: alert, cooperative and no distress Lochia:normal flow Chest: normal WOB Heart: Regular rate/rhythm Abdomen: +BS, soft, moderate TTP (appropriate) Uterine Fundus: firm DVT Evaluation: No evidence of DVT seen on physical exam. Compression sleeves in place bilaterally. Extremities: trace edema   Recent Labs  01/05/16 1115 01/06/16 1713  HGB 13.5 12.7  HCT 39.1 36.9    Assessment/Plan:  ASSESSMENT: [name] [name] is a 20 y.o. [id number] 4655w6d s/p rLTCS, doing well.   #Pain: Well controlled #FEN/GI: Will advance diet as tolerated. Tolerating sips/chips. #Intermittent Hypotension: d/c labetalol. Will continue to monitor. #postpartum care: -Continue routine PP care -Breastfeeding support PRN (pt currently pumping) - Lactation Consult - Contraception (unsure, but thinking POPs) -Anticipate d/c POD #3   LOS: 2 days   Barnie Mortolleen McGuire, Med Student   Attestation of Attending Supervision of  MedicalStudent: Evaluation and management procedures were performed by the Student under my supervision.  I have seen and examined the patient, reviewed the the student's note and chart, and I agree with management and plan.  Jaynie CollinsUGONNA  Arcadia Gorgas, MD, FACOG Attending Obstetrician & Gynecologist Faculty Practice, [facility]

## 2016-01-07 NOTE — Progress Notes (Signed)
Pt instructed on first 3 voids in hat for measurements, pt states understanding

## 2016-01-07 NOTE — Progress Notes (Signed)
rn assist pt with breast pump

## 2016-01-07 NOTE — Lactation Note (Signed)
This note was copied from a baby's chart. Lactation Consultation Note  Patient Name: [name] [name] ZOXWR'UToday's Date: 01/07/2016 Reason for consult: Follow-up assessment;NICU baby;Infant < 6lbs;Late preterm infant   Follow up with mom of 35 hour old NICU Infant at infant's bedside. Mom reports she is pumping although not every 3 hours. She is getting Colostrum and bringing it to the baby.   Mom without questions at this time. She reports she it is recommended she pump every 2-3 hours for 15 minutes on initiate setting. Mom reports she has a pump at home. She reports her first infant was in NICU and did later go on to BF.   Follow up tomorrow and prn.    Maternal Data Formula Feeding for Exclusion: No Does the patient have breastfeeding experience prior to this delivery?: Yes  Feeding Feeding Type: Formula Length of feed: 45 min  LATCH Score/Interventions                      Lactation Tools Discussed/Used     Consult Status Consult Status: Follow-up Date: 01/08/16 Follow-up type: In-patient    [name] [name] 01/07/2016, 1:47 PM

## 2016-01-08 MED ORDER — HYDRALAZINE HCL 20 MG/ML IJ SOLN
10.0000 mg | Freq: Once | INTRAMUSCULAR | Status: AC | PRN
  Administered 2016-01-08: 10 mg via INTRAVENOUS
  Filled 2016-01-08: qty 1

## 2016-01-08 MED ORDER — LABETALOL HCL 5 MG/ML IV SOLN: 20.0000 mg | INTRAVENOUS | Status: DC | PRN

## 2016-01-08 MED ORDER — HYDROCHLOROTHIAZIDE 25 MG PO TABS
25.0000 mg | ORAL_TABLET | Freq: Every day | ORAL | Status: DC
  Administered 2016-01-08 – 2016-01-09 (×2): 25 mg via ORAL
  Filled 2016-01-08 (×3): qty 1

## 2016-01-08 MED ORDER — IBUPROFEN 600 MG PO TABS: 600.0000 mg | ORAL_TABLET | Freq: Four times a day (QID) | ORAL | Status: DC

## 2016-01-08 MED ORDER — SENNOSIDES-DOCUSATE SODIUM 8.6-50 MG PO TABS: 1.0000 | ORAL_TABLET | Freq: Every evening | ORAL | Status: DC | PRN

## 2016-01-08 MED ORDER — SIMETHICONE 80 MG PO CHEW: 80.0000 mg | CHEWABLE_TABLET | Freq: Three times a day (TID) | ORAL | Status: DC

## 2016-01-08 MED ORDER — HYDROCHLOROTHIAZIDE 25 MG PO TABS: 25.0000 mg | ORAL_TABLET | Freq: Every day | ORAL | Status: DC

## 2016-01-08 MED ORDER — NIFEDIPINE ER OSMOTIC RELEASE 30 MG PO TB24
30.0000 mg | ORAL_TABLET | Freq: Every day | ORAL | Status: DC
  Administered 2016-01-08 – 2016-01-09 (×2): 30 mg via ORAL
  Filled 2016-01-08 (×2): qty 1

## 2016-01-08 MED ORDER — LABETALOL HCL 5 MG/ML IV SOLN
20.0000 mg | INTRAVENOUS | Status: DC | PRN
Start: 2016-01-08 — End: 2016-01-09
  Administered 2016-01-08: 20 mg via INTRAVENOUS
  Filled 2016-01-08: qty 4

## 2016-01-08 MED ORDER — OXYCODONE-ACETAMINOPHEN 5-325 MG PO TABS: 1.0000 | ORAL_TABLET | ORAL | Status: DC | PRN

## 2016-01-08 NOTE — Discharge Summary (Signed)
OB Discharge Summary     Patient Name: [name] [name] DOB: 04/03/1996 MRN: [id number]  Date of admission: 01/05/2016 Delivering MD: Catalina AntiguaONSTANT, PEGGY   Date of discharge: 01/08/2016  Admitting diagnosis: 35WKS, PREECLAMPSIA Intrauterine pregnancy: 223w6d     Secondary diagnosis:  Active Problems:   Severe preeclampsia  Additional problems: Failed TOLAC, rLTCS for NRFHT, superimposed pre-eclampsia with severe features, pre-term delivery, GBS positive-treated with PCN     Discharge diagnosis: Preterm Pregnancy Delivered, Preeclampsia (severe) and CHTN with superimposed preeclampsia                                                                                                Post partum procedures:None  Augmentation: AROM and Pitocin  Complications: None  Hospital course:  Induction of Labor With Cesarean Section  20 y.o. yo G2P0202 at 5323w6d was admitted to the hospital 01/05/2016 for induction of labor. Patient had a labor course significant for failed TOLAC after AROM and pitocin. The patient went for cesarean section due to Non-Reassuring FHR, and delivered a Viable infant, Membrane Rupture Time/Date: )1:58 AM ,01/06/2016   @Details  of operation can be found in separate operative Note.  Patient had an uncomplicated postpartum course. She is ambulating, tolerating a regular diet, passing flatus, and urinating well.  Patient is discharged home in stable condition on 01/08/2016.                                     Physical exam  Filed Vitals:   01/08/16 1219 01/08/16 1256 01/08/16 1418 01/08/16 1625  BP: 168/99 154/99 149/92 175/112  Pulse: 96 79 83 95  Temp: 99.5 F (37.5 C)     TempSrc: Oral     Resp: 18     Height:      Weight:      SpO2:       General: alert, cooperative and no distress Lochia: appropriate Uterine Fundus: firm Incision: Healing well with no significant drainage, No significant erythema, Dressing is clean, dry, and intact DVT Evaluation: No evidence of DVT  seen on physical exam. Labs: Lab Results  Component Value Date   WBC 12.9* 01/06/2016   HGB 12.7 01/06/2016   HCT 36.9 01/06/2016   MCV 86.6 01/06/2016   PLT 252 01/06/2016   CMP Latest Ref Rng 01/05/2016  Glucose 65 - 99 mg/dL 82  BUN 6 - 20 mg/dL 11  Creatinine 0.100.44 - 2.721.00 mg/dL 5.360.59  Sodium 644135 - 034145 mmol/L 134(L)  Potassium 3.5 - 5.1 mmol/L 4.1  Chloride 101 - 111 mmol/L 108  CO2 22 - 32 mmol/L 19(L)  Calcium 8.9 - 10.3 mg/dL 9.2  Total Protein 6.5 - 8.1 g/dL 6.6  Total Bilirubin 0.3 - 1.2 mg/dL 0.4  Alkaline Phos 38 - 126 U/L 140(H)  AST 15 - 41 U/L 24  ALT 14 - 54 U/L 24    Discharge instruction: per After Visit Summary and "Baby and Me Booklet".  After visit meds:    Medication List    STOP taking these  medications        labetalol 300 MG tablet  Commonly known as:  NORMODYNE      TAKE these medications        diphenhydramine-acetaminophen 25-500 MG Tabs tablet  Commonly known as:  TYLENOL PM  Take 1 tablet by mouth at bedtime as needed (sleep).     hydrochlorothiazide 25 MG tablet  Commonly known as:  HYDRODIURIL  Take 1 tablet (25 mg total) by mouth daily.     ibuprofen 600 MG tablet  Commonly known as:  ADVIL,MOTRIN  Take 1 tablet (600 mg total) by mouth every 6 (six) hours.     oxyCODONE-acetaminophen 5-325 MG tablet  Commonly known as:  PERCOCET/ROXICET  Take 1-2 tablets by mouth every 4 (four) hours as needed for severe pain.     prenatal multivitamin Tabs tablet  Take 1 tablet by mouth daily at 12 noon.     senna-docusate 8.6-50 MG tablet  Commonly known as:  Senokot-S  Take 1 tablet by mouth at bedtime as needed for mild constipation.     simethicone 80 MG chewable tablet  Commonly known as:  MYLICON  Chew 1 tablet (80 mg total) by mouth 3 (three) times daily after meals.        Diet: low salt diet  Activity: Advance as tolerated. Pelvic rest for 6 weeks.   Outpatient follow up:2 weeks for c-section incision check, 6 weeks for  postpartum check .  Follow up Appt:No future appointments. Follow up Visit:No Follow-up on file.  Postpartum contraception: Progesterone only pills, will consider IUD (used Nexplanon in the past with no success)  Newborn Data: Live born female  Birth Weight: 3 lb 13.7 oz (1750 g) APGAR: 8,9   Baby Feeding: Breast Disposition:NICU being transferred today to another NICU   01/08/2016 Olena Leatherwood, MD

## 2016-01-08 NOTE — Lactation Note (Signed)
This note was copied from a baby's chart. Lactation Consultation Note  Follow up visit made.  Mom pumping every 3 hours and just obtained 60 mls from each breast of transitional milk.  Praised mom for her efforts.  No questions or concerns at present time.  Patient Name: [name] Burt Knackbony Darling UJWJX'BToday's Date: 01/08/2016     Maternal Data    Feeding Feeding Type: Formula Length of feed: 45 min  LATCH Score/Interventions                      Lactation Tools Discussed/Used     Consult Status      Huston FoleyMOULDEN, Julio Storr S 01/08/2016, 3:21 PM

## 2016-01-08 NOTE — Progress Notes (Signed)
Post Partum Day 2 Subjective:  [name] [name] is a 20 y.o. [id number]  s/p rLTCS at 2226w6d on 01/06/16 for non-reassuring fetal status and severe preeclampsia.  Patient denies headaches, visual symptoms, RUQ pain.  She denies nausea or vomiting.  Pain is well controlled.  She has had flatus.  Lochia Small.  Plan for birth control is oral progesterone-only contraceptive.  Method of Feeding: Plans to breast feed, baby currently in NICU and doing well.  Objective: Blood pressure 150/88, pulse 78, temperature 98 F (36.7 C), temperature source Oral, resp. rate 16, height 5\' 6"  (1.676 m), weight 219 lb (99.338 kg), last menstrual period 03/06/2015, SpO2 100 %, unknown if currently breastfeeding.   Filed Vitals:   01/07/16 1016 01/07/16 1607 01/07/16 2107 01/08/16 0430  BP: 127/79 137/79 148/77 150/88  Pulse: 80 94 78 78  Temp: 98.1 F (36.7 C) 98.6 F (37 C) 98.7 F (37.1 C) 98 F (36.7 C)  TempSrc: Oral Oral Oral Oral  Resp: 16 16 18 16   Height:      Weight:      SpO2:        Physical Exam:  General: alert, cooperative and no distress Lochia:normal flow Chest: normal WOB Heart: Regular rate/rhythm Abdomen: +BS, soft, moderate TTP (appropriate) Uterine Fundus: firm DVT Evaluation: No evidence of DVT seen on physical exam. Compression sleeves in place bilaterally. Extremities: trace edema   Recent Labs  01/05/16 1115 01/06/16 1713  HGB 13.5 12.7  HCT 39.1 36.9    Assessment/Plan:  ASSESSMENT: [name] [name] is a 20 y.o. A5W0981G2P0202 POD#2 s/p rLTCS, doing well.   #Pain: Well controlled #FEN/GI: Regular diet as tolerated. Tolerating sips/chips. #Intermittent Hypotension: Started HCTZ 25 mg po qd #Postpartum care: -Continue routine PP care - Breastfeeding support PRN (pt currently pumping) - Lactation Consult - Contraception: POPs - Anticipate discharge tomorrow   LOS: 3 days   Nilda Keathley A, MD

## 2016-01-08 NOTE — Progress Notes (Signed)
Pt instructed to lie down and relax and will return to recheck BP in 15-20 min]

## 2016-01-08 NOTE — Progress Notes (Signed)
Pt requesting WCR to see her baby with a family member, approved by MD, pt instructed to return ASAP if she started feeling bad. Pt denies pain, h/a, visual changes or epigastric pain at this time

## 2016-01-09 DIAGNOSIS — Z98891 History of uterine scar from previous surgery: Secondary | ICD-10-CM

## 2016-01-09 MED ORDER — NIFEDIPINE ER 30 MG PO TB24: 30.0000 mg | ORAL_TABLET | Freq: Every day | ORAL | Status: DC

## 2016-01-09 NOTE — Progress Notes (Signed)
Pt given discharge information and follow-up reviewed. Pt verbalizes understanding

## 2016-01-09 NOTE — Lactation Note (Signed)
This note was copied from a baby'[name] chart. Lactation Consultation Note  Follow up visit made.  Mom is pumping every 3 hours and obtaining about 120 mls each pumping.  Breasts comfortable.  Mom has a medela DEBP at home.  BAby will be transferred to [place] today because closer to home.  Lactation outpatient services reviewed and encouraged.  Patient Name: [name] [name]     Maternal Data    Feeding Feeding Type: Breast Milk Nipple Type: Slow - flow Length of feed: 30 min  LATCH Score/Interventions                      Lactation Tools Discussed/Used     Consult Status      Huston FoleyMOULDEN, [name] [name] [name], 8:59 AM

## 2016-01-09 NOTE — Discharge Instructions (Signed)
Postpartum Care After Cesarean Delivery °After you deliver your newborn (postpartum period), the usual stay in the hospital is 24-72 hours. If there were problems with your labor or delivery, or if you have other medical problems, you might be in the hospital longer.  °While you are in the hospital, you will receive help and instructions on how to care for yourself and your newborn during the postpartum period.  °While you are in the hospital: °· It is normal for you to have pain or discomfort from the incision in your abdomen. Be sure to tell your nurses when you are having pain, where the pain is located, and what makes the pain worse. °· If you are breastfeeding, you may feel uncomfortable contractions of your uterus for a couple of weeks. This is normal. The contractions help your uterus get back to normal size. °· It is normal to have some bleeding after delivery. °¨ For the first 1-3 days after delivery, the flow is red and the amount may be similar to a period. °¨ It is common for the flow to start and stop. °¨ In the first few days, you may pass some small clots. Let your nurses know if you begin to pass large clots or your flow increases. °¨ Do not  flush blood clots down the toilet before having the nurse look at them. °¨ During the next 3-10 days after delivery, your flow should become more watery and pink or brown-tinged in color. °¨ Ten to fourteen days after delivery, your flow should be a small amount of yellowish-white discharge. °¨ The amount of your flow will decrease over the first few weeks after delivery. Your flow may stop in 6-8 weeks. Most women have had their flow stop by 12 weeks after delivery. °· You should change your sanitary pads frequently. °· Wash your hands thoroughly with soap and water for at least 20 seconds after changing pads, using the toilet, or before holding or feeding your newborn. °· Your intravenous (IV) tubing will be removed when you are drinking enough fluids. °· The  urine drainage tube (urinary catheter) that was inserted before delivery may be removed within 6-8 hours after delivery or when feeling returns to your legs. You should feel like you need to empty your bladder within the first 6-8 hours after the catheter has been removed. °· In case you become weak, lightheaded, or faint, call your nurse before you get out of bed for the first time and before you take a shower for the first time. °· Within the first few days after delivery, your breasts may begin to feel tender and full. This is called engorgement. Breast tenderness usually goes away within 48-72 hours after engorgement occurs. You may also notice milk leaking from your breasts. If you are not breastfeeding, do not stimulate your breasts. Breast stimulation can make your breasts produce more milk. °· Spending as much time as possible with your newborn is very important. During this time, you and your newborn can feel close and get to know each other. Having your newborn stay in your room (rooming in) will help to strengthen the bond with your newborn. It will give you time to get to know your newborn and become comfortable caring for your newborn. °· Your hormones change after delivery. Sometimes the hormone changes can temporarily cause you to feel sad or tearful. These feelings should not last more than a few days. If these feelings last longer than that, you should talk to your   caregiver.  If desired, talk to your caregiver about methods of family planning or contraception.  Talk to your caregiver about immunizations. Your caregiver may want you to have the following immunizations before leaving the hospital:  Tetanus, diphtheria, and pertussis (Tdap) or tetanus and diphtheria (Td) immunization. It is very important that you and your family (including grandparents) or others caring for your newborn are up-to-date with the Tdap or Td immunizations. The Tdap or Td immunization can help protect your newborn  from getting ill.  Rubella immunization.  Varicella (chickenpox) immunization.  Influenza immunization. You should receive this annual immunization if you did not receive the immunization during your pregnancy.   This information is not intended to replace advice given to you by your health care provider. Make sure you discuss any questions you have with your health care provider.   Document Released: 04/11/2012 Document Reviewed: 04/11/2012 Elsevier Interactive Patient Education [employer] Cesarean Delivery, Care After Refer to this sheet in the next few weeks. These instructions provide you with information on caring for yourself after your procedure. Your health care provider may also give you specific instructions. Your treatment has been planned according to current medical practices, but problems sometimes occur. Call your health care provider if you have any problems or questions after you go home. HOME CARE INSTRUCTIONS  Only take over-the-counter or prescription medications as directed by your health care provider.  Do not drink alcohol, especially if you are breastfeeding or taking medication to relieve pain.  Do not chew or smoke tobacco.  Continue to use good perineal care. Good perineal care includes:  Wiping your perineum from front to back.  Keeping your perineum clean.  Check your surgical cut (incision) daily for increased redness, drainage, swelling, or separation of skin.  Clean your incision gently with soap and water every day, and then pat it dry. If your health care provider says it is okay, leave the incision uncovered. Use a bandage (dressing) if the incision is draining fluid or appears irritated. If the adhesive strips across the incision do not fall off within 7 days, carefully peel them off.  Hug a pillow when coughing or sneezing until your incision is healed. This helps to relieve pain.  Do not use tampons or douche until your health care  provider says it is okay.  Shower, wash your hair, and take tub baths as directed by your health care provider.  Wear a well-fitting bra that provides breast support.  Limit wearing support panties or control-top hose.  Drink enough fluids to keep your urine clear or pale yellow.  Eat high-fiber foods such as whole grain cereals and breads, brown rice, beans, and fresh fruits and vegetables every day. These foods may help prevent or relieve constipation.  Resume activities such as climbing stairs, driving, lifting, exercising, or traveling as directed by your health care provider.  Talk to your health care provider about resuming sexual activities. This is dependent upon your risk of infection, your rate of healing, and your comfort and desire to resume sexual activity.  Try to have someone help you with your household activities and your newborn for at least a few days after you leave the hospital.  Rest as much as possible. Try to rest or take a nap when your newborn is sleeping.  Increase your activities gradually.  Keep all of your scheduled postpartum appointments. It is very important to keep your scheduled follow-up appointments. At these appointments, your health care provider will be  checking to make sure that you are healing physically and emotionally. SEEK MEDICAL CARE IF:   You are passing large clots from your vagina. Save any clots to show your health care provider.  You have a foul smelling discharge from your vagina.  You have trouble urinating.  You are urinating frequently.  You have pain when you urinate.  You have a change in your bowel movements.  You have increasing redness, pain, or swelling near your incision.  You have pus draining from your incision.  Your incision is separating.  You have painful, hard, or reddened breasts.  You have a severe headache.  You have blurred vision or see spots.  You feel sad or depressed.  You have thoughts of  hurting yourself or your newborn.  You have questions about your care, the care of your newborn, or medications.  You are dizzy or light-headed.  You have a rash.  You have pain, redness, or swelling at the site of the removed intravenous access (IV) tube.  You have nausea or vomiting.  You stopped breastfeeding and have not had a menstrual period within 12 weeks of stopping.  You are not breastfeeding and have not had a menstrual period within 12 weeks of delivery.  You have a fever. SEEK IMMEDIATE MEDICAL CARE IF:  You have persistent pain.  You have chest pain.  You have shortness of breath.  You faint.  You have leg pain.  You have stomach pain.  Your vaginal bleeding saturates 2 or more sanitary pads in 1 hour. MAKE SURE YOU:   Understand these instructions.  Will watch your condition.  Will get help right away if you are not doing well or get worse.   This information is not intended to replace advice given to you by your health care provider. Make sure you discuss any questions you have with your health care provider.   Document Released: 04/09/2002 Document Revised: 08/08/2014 Document Reviewed: 03/14/2012 Elsevier Interactive Patient Education [employer]

## 2016-01-09 NOTE — Progress Notes (Signed)
Pt in NICU with family

## 2016-01-11 ENCOUNTER — Other Ambulatory Visit: Payer: Medicaid Other

## 2016-01-11 ENCOUNTER — Ambulatory Visit (HOSPITAL_COMMUNITY): Payer: Medicaid Other

## 2016-01-31 ENCOUNTER — Encounter (HOSPITAL_COMMUNITY): Payer: Self-pay | Admitting: *Deleted

## 2016-03-07 ENCOUNTER — Encounter: Payer: Self-pay | Admitting: Obstetrics & Gynecology

## 2016-03-07 ENCOUNTER — Ambulatory Visit (INDEPENDENT_AMBULATORY_CARE_PROVIDER_SITE_OTHER): Payer: Medicaid Other | Admitting: Obstetrics & Gynecology

## 2016-03-07 DIAGNOSIS — Z3202 Encounter for pregnancy test, result negative: Secondary | ICD-10-CM | POA: Diagnosis not present

## 2016-03-07 DIAGNOSIS — Z30017 Encounter for initial prescription of implantable subdermal contraceptive: Secondary | ICD-10-CM | POA: Diagnosis not present

## 2016-03-07 LAB — POCT PREGNANCY, URINE: PREG TEST UR: NEGATIVE

## 2016-03-07 MED ORDER — ETONOGESTREL 68 MG ~~LOC~~ IMPL
68.0000 mg | DRUG_IMPLANT | Freq: Once | SUBCUTANEOUS | Status: AC
  Administered 2016-03-07: 68 mg via SUBCUTANEOUS

## 2016-03-07 NOTE — Progress Notes (Signed)
Subjective:requests Nexplanon, has used this previously     [name] [name] is a 20 y.o. female who presents for a postpartum visit. She is 7 weeks postpartum following a c/section. I have fully reviewed the prenatal and intrapartum course. The delivery was at [id number] gestational weeks. Outcome: repeat cesarean section, low transverse incision. Anesthesia: spinal. Postpartum course has been good. Baby's course has been normal. Baby is feeding by bottle - Similac Advance. Bleeding no bleeding. Bowel function is normal. Bladder function is normal. Patient is sexually active. Contraception method is Nexplanon. Postpartum depression screening: negative. No intercourse for 1 month The following portions of the patient's history were reviewed and updated as appropriate: allergies, current medications, past family history, past medical history, past social history, past surgical history and problem list.  Review of Systems Pertinent items are noted in HPI.   Objective:    There were no vitals taken for this visit.  General:  alert and cooperative        Heart:     Abdomen: soft, non-tender; bowel sounds normal; no masses,  no organomegaly and incision healing well   Vulva:  not evaluated  Vagina: not evaluated                   Patient given informed consent, signed copy in the chart, time out was performed. Pregnancy test was negative Appropriate time out taken.  Patient's left arm was prepped and draped in the usual sterile fashion.. The ruler used to measure and mark insertion area.  Pt was prepped with alcohol swab and then injected with 3 cc of 1 % lidocaine.  Pt was prepped with betadine, Nexplanon removed form packaging. Then inserted per standard guidelines. Patient and provider were able to palpate rod under skin. Pt insertion site covered with sterile dressing.   Minimal blood loss.  Pt tolerated the procedure well.  Assessment:     normal postpartum exam. Pap smear not done at today's  visit.   Plan:    1. Contraception: Nexplanon 2. Device placed today 3. Follow up as needed.    Adam PhenixJames G [name], MD 03/07/2016

## 2016-03-22 ENCOUNTER — Emergency Department
Admission: EM | Admit: 2016-03-22 | Discharge: 2016-03-22 | Disposition: A | Discharge status: Home / Self Care | Payer: Medicaid Other | Attending: Emergency Medicine | Admitting: Emergency Medicine

## 2016-03-22 ENCOUNTER — Encounter: Payer: Self-pay | Admitting: Emergency Medicine

## 2016-03-22 DIAGNOSIS — I10 Essential (primary) hypertension: Secondary | ICD-10-CM | POA: Insufficient documentation

## 2016-03-22 DIAGNOSIS — R300 Dysuria: Secondary | ICD-10-CM | POA: Diagnosis present

## 2016-03-22 DIAGNOSIS — N760 Acute vaginitis: Secondary | ICD-10-CM | POA: Diagnosis not present

## 2016-03-22 DIAGNOSIS — N390 Urinary tract infection, site not specified: Secondary | ICD-10-CM

## 2016-03-22 DIAGNOSIS — Z79899 Other long term (current) drug therapy: Secondary | ICD-10-CM | POA: Insufficient documentation

## 2016-03-22 DIAGNOSIS — Z87891 Personal history of nicotine dependence: Secondary | ICD-10-CM | POA: Insufficient documentation

## 2016-03-22 DIAGNOSIS — B9689 Other specified bacterial agents as the cause of diseases classified elsewhere: Secondary | ICD-10-CM

## 2016-03-22 LAB — CHLAMYDIA/NGC RT PCR (ARMC ONLY)
Chlamydia Tr: DETECTED — AB
N gonorrhoeae: NOT DETECTED

## 2016-03-22 LAB — URINALYSIS COMPLETE WITH MICROSCOPIC (ARMC ONLY)
BILIRUBIN URINE: NEGATIVE
GLUCOSE, UA: NEGATIVE mg/dL
Ketones, ur: NEGATIVE mg/dL
NITRITE: POSITIVE — AB
Protein, ur: 100 mg/dL — AB
Specific Gravity, Urine: 1.015 (ref 1.005–1.030)
pH: 6 (ref 5.0–8.0)

## 2016-03-22 LAB — WET PREP, GENITAL
SPERM: NONE SEEN
TRICH WET PREP: NONE SEEN
YEAST WET PREP: NONE SEEN

## 2016-03-22 LAB — POCT PREGNANCY, URINE: PREG TEST UR: NEGATIVE

## 2016-03-22 MED ORDER — METRONIDAZOLE 500 MG PO TABS
500.0000 mg | ORAL_TABLET | Freq: Once | ORAL | Status: AC
  Administered 2016-03-22: 500 mg via ORAL
  Filled 2016-03-22: qty 1

## 2016-03-22 MED ORDER — METRONIDAZOLE 500 MG PO TABS: 500.0000 mg | ORAL_TABLET | Freq: Two times a day (BID) | ORAL | 0 refills | Status: AC

## 2016-03-22 MED ORDER — DOXYCYCLINE HYCLATE 100 MG PO TABS: 100.0000 mg | ORAL_TABLET | Freq: Two times a day (BID) | ORAL | 0 refills | Status: DC

## 2016-03-22 MED ORDER — DOXYCYCLINE HYCLATE 100 MG PO TABS
100.0000 mg | ORAL_TABLET | Freq: Once | ORAL | Status: AC
  Administered 2016-03-22: 100 mg via ORAL
  Filled 2016-03-22: qty 1

## 2016-03-22 MED ORDER — CEPHALEXIN 500 MG PO CAPS: 500.0000 mg | ORAL_CAPSULE | Freq: Three times a day (TID) | ORAL | 0 refills | Status: AC

## 2016-03-22 MED ORDER — CEFTRIAXONE SODIUM 250 MG IJ SOLR
250.0000 mg | INTRAMUSCULAR | Status: DC
  Administered 2016-03-22: 250 mg via INTRAMUSCULAR
  Filled 2016-03-22: qty 250

## 2016-03-22 NOTE — ED Triage Notes (Signed)
Patient ambulatory to triage with steady gait, without difficulty or distress noted; pt st dysuria and odor to urine today; denies abd or back pain

## 2016-03-22 NOTE — ED Provider Notes (Signed)
[facility] Emergency Department Provider Note   ____________________________________________   First MD Initiated Contact with Patient 03/22/16 0430     (approximate)  I have reviewed the triage vital signs and the nursing notes.   HISTORY  Chief Complaint Dysuria    HPI [name] [name] is a 20 y.o. female who comes into the hospital today with a concern for STDs. The patient reports that she has a UTI but she thinks she is been exposed to chlamydia. The patient reports that her significant other went to the doctor and she just found out that he was treated for chlamydia. She reports that she's not sure if he still has it and she has been having unprotected sex with him. The patient reports that she's had some abdominal bloating but denies any discharge or abdominal pain. The patient has had some burning with urination and frequency in her urination. She has not had any fevers. She is here for evaluation.   Past Medical History:  Diagnosis Date  . Chlamydia   . Hypertension   . Infection    UTI  . Ovarian cyst   . Pregnancy induced hypertension     There are no active problems to display for this patient.   Past Surgical History:  Procedure Laterality Date  . CESAREAN SECTION N/A 03/21/2013   Procedure: CESAREAN SECTION;  Surgeon: Willodean Rosenthal, MD;  Location: [facility] ORS;  Service: Obstetrics;  Laterality: N/A;  . CESAREAN SECTION N/A 01/06/2016   Procedure: CESAREAN SECTION;  Surgeon: Catalina Antigua, MD;  Location: [facility]IRTH[facility] SU[facility];  Service: Obstetrics;  Laterality: N/A;    Prior to Admission medications   Medication Sig Start Date End Date Taking? Authorizing Provider  cephALEXin (KEFLEX) 500 MG capsule Take 1 capsule (500 mg total) by mouth 3 (three) times daily. 03/22/16 03/29/16  Rebecka Apley, MD  diphenhydramine-acetaminophen (TYLENOL PM) 25-500 MG TABS tablet Take 1 tablet by mouth at bedtime as needed (sleep).     Historical  Provider, MD  doxycycline (VIBRA-TABS) 100 MG tablet Take 1 tablet (100 mg total) by mouth 2 (two) times daily. 03/22/16   Rebecka Apley, MD  hydrochlorothiazide (HYDRODIURIL) 25 MG tablet Take 1 tablet (25 mg total) by mouth daily. Patient not taking: Reported on 03/07/2016 01/08/16   Olena Leatherwood, MD  ibuprofen (ADVIL,MOTRIN) 600 MG tablet Take 1 tablet (600 mg total) by mouth every 6 (six) hours. 01/08/16   Olena Leatherwood, MD  metroNIDAZOLE (FLAGYL) 500 MG tablet Take 1 tablet (500 mg total) by mouth 2 (two) times daily. 03/22/16 03/29/16  Rebecka Apley, MD  NIFEdipine (PROCARDIA-XL/ADALAT CC) 30 MG 24 hr tablet Take 1 tablet (30 mg total) by mouth daily. Patient not taking: Reported on 03/07/2016 01/09/16   Levie Heritage, DO    Allergies Review of patient's allergies indicates no known allergies.  Family History  Problem Relation Age of Onset  . Asthma Mother   . Cancer Paternal Grandfather     Social History Social History  Substance Use Topics  . Smoking status: Former Smoker    Types: Cigarettes    Quit date: 07/27/2015  . Smokeless tobacco: Never Used  . Alcohol use No    Review of Systems Constitutional: No fever/chills Eyes: No visual changes. ENT: No sore throat. Cardiovascular: Denies chest pain. Respiratory: Denies shortness of breath. Gastrointestinal: No abdominal pain.  No nausea, no vomiting.  No diarrhea.  No constipation. Genitourinary: dysuria, urgency, frequency Musculoskeletal: Negative for back pain.  Skin: Negative for rash. Neurological: Negative for headaches, focal weakness or numbness.  10-point ROS otherwise negative.  ____________________________________________   PHYSICAL EXAM:  VITAL SIGNS: ED Triage Vitals  Enc Vitals Group     BP 03/22/16 0340 133/84     Pulse Rate 03/22/16 0340 99     Resp 03/22/16 0340 20     Temp 03/22/16 0340 97.7 F (36.5 C)     Temp Source 03/22/16 0340 Oral     SpO2 03/22/16 0340 100 %     Weight  03/22/16 0338 200 lb (90.7 kg)     Height 03/22/16 0338 5\' 6"  (1.676 m)     Head Circumference --      Peak Flow --      Pain Score --      Pain Loc --      Pain Edu? --      Excl. in GC? --     Constitutional: Alert and oriented. Well appearing and in no acute distress. Eyes: Conjunctivae are normal. PERRL. EOMI. Head: Atraumatic. Nose: No congestion/rhinnorhea. Mouth/Throat: Mucous membranes are moist.  Oropharynx non-erythematous. Cardiovascular: Normal rate, regular rhythm. Grossly normal heart sounds.  Good peripheral circulation. Respiratory: Normal respiratory effort.  No retractions. Lungs CTAB. Gastrointestinal: Soft and nontender. No distention. positive bowel sounds Genitourinary: Normal external genitalia, some white/yellow vaginal discharge, no cervical motion tenderness, no uterine or adnexal tenderness to palpation. Musculoskeletal: No lower extremity tenderness nor edema.   Neurologic:  Normal speech and language.  Skin:  Skin is warm, dry and intact.  Psychiatric: Mood and affect are normal.   ____________________________________________   LABS (all labs ordered are listed, but only abnormal results are displayed)  Labs Reviewed  WET PREP, GENITAL - Abnormal; Notable for the following:       Result Value   Clue Cells Wet Prep HPF POC PRESENT (*)    WBC, Wet Prep HPF POC MODERATE (*)    All other components within normal limits  URINALYSIS COMPLETEWITH MICROSCOPIC ([facility] ONLY) - Abnormal; Notable for the following:    Color, Urine YELLOW (*)    APPearance CLOUDY (*)    Hgb urine dipstick 3+ (*)    Protein, ur 100 (*)    Nitrite POSITIVE (*)    Leukocytes, UA 3+ (*)    Bacteria, UA MANY (*)    Squamous Epithelial / LPF 0-5 (*)    All other components within normal limits  URINE CULTURE  CHLAMYDIA/NGC RT PCR ([facility] ONLY)  POCT PREGNANCY, URINE    ____________________________________________  EKG  none ____________________________________________  RADIOLOGY  none ____________________________________________   PROCEDURES  Procedure(s) performed: None  Procedures  Critical Care performed: No  ____________________________________________   INITIAL IMPRESSION / ASSESSMENT AND PLAN / ED COURSE  Pertinent labs & imaging results that were available during my care of the patient were reviewed by me and considered in my medical decision making (see chart for details).  This is a 20 year old female who comes into the hospital today with a concern for chlamydia. The patient does appear to have a UTI. I will test the patient for GC and chlamydia and go ahead and treat her. I am awaiting the results of her wet prep.  Clinical Course   The patient's wet prep shows clue cells which is indicative of bacterial vaginosis. As the tests for GC and Chlamydia take some time I will treat the patient. The patient did receive a dose of ceftriaxone, doxycycline and metronidazole. I will discharge the patient  home to follow-up with her primary care physician.   ____________________________________________   FINAL CLINICAL IMPRESSION(S) / ED DIAGNOSES  Final diagnoses:  UTI (lower urinary tract infection)  BV (bacterial vaginosis)      NEW MEDICATIONS STARTED DURING THIS VISIT:  New Prescriptions   CEPHALEXIN (KEFLEX) 500 MG CAPSULE    Take 1 capsule (500 mg total) by mouth 3 (three) times daily.   DOXYCYCLINE (VIBRA-TABS) 100 MG TABLET    Take 1 tablet (100 mg total) by mouth 2 (two) times daily.   METRONIDAZOLE (FLAGYL) 500 MG TABLET    Take 1 tablet (500 mg total) by mouth 2 (two) times daily.     Note:  This document was prepared using Dragon voice recognition software and may include unintentional dictation errors.    Rebecka ApleyAllison P Kamrynn Melott, MD 03/22/16 [phone]

## 2016-03-22 NOTE — ED Notes (Signed)
Pt c/o dysuria, hematuria, and odor to urine since yesterday, Monday. Pt reports wants to be tested for STD. Pt denies dizziness, fever, or shortness of breath. Pt alert and oriented x 4, no increased work in breathing noted. MD at bedside.

## 2016-03-24 LAB — URINE CULTURE

## 2016-03-25 ENCOUNTER — Telehealth: Payer: Self-pay | Admitting: Emergency Medicine

## 2016-03-25 NOTE — Telephone Encounter (Signed)
Called patient to inform of positive chlamydia, need for partner treatment.  Patient was treated int he ED.  Phone will not take messages.  Will send letter asking pt to call me.

## 2016-03-25 NOTE — Progress Notes (Signed)
ED Culture Results   Allergies: NKDA Visit Date: 03/22/16 Chief Complaint: dysuria Culture Type: urine Culture Results: E. coli Original Abx given: Cephalexin  Original Abx sensitive, intermediate, or resistant: sensitive Recommended Abx: ED Physician: Contacted Patient: No, patient on appropriate therapy Prescription Called into:    [name] [name]  12:44 PM 03/25/2016

## 2016-10-21 ENCOUNTER — Emergency Department
Admission: EM | Admit: 2016-10-21 | Discharge: 2016-10-21 | Disposition: A | Discharge status: Home / Self Care | Payer: Medicaid Other | Attending: Emergency Medicine | Admitting: Emergency Medicine

## 2016-10-21 ENCOUNTER — Encounter: Payer: Self-pay | Admitting: Emergency Medicine

## 2016-10-21 DIAGNOSIS — Z79899 Other long term (current) drug therapy: Secondary | ICD-10-CM | POA: Insufficient documentation

## 2016-10-21 DIAGNOSIS — Z87891 Personal history of nicotine dependence: Secondary | ICD-10-CM | POA: Insufficient documentation

## 2016-10-21 DIAGNOSIS — I10 Essential (primary) hypertension: Secondary | ICD-10-CM | POA: Diagnosis not present

## 2016-10-21 DIAGNOSIS — N898 Other specified noninflammatory disorders of vagina: Secondary | ICD-10-CM

## 2016-10-21 DIAGNOSIS — N72 Inflammatory disease of cervix uteri: Secondary | ICD-10-CM | POA: Insufficient documentation

## 2016-10-21 LAB — WET PREP, GENITAL
Clue Cells Wet Prep HPF POC: NONE SEEN
Sperm: NONE SEEN
Trich, Wet Prep: NONE SEEN
Yeast Wet Prep HPF POC: NONE SEEN

## 2016-10-21 LAB — CHLAMYDIA/NGC RT PCR (ARMC ONLY)
CHLAMYDIA TR: NOT DETECTED
N GONORRHOEAE: NOT DETECTED

## 2016-10-21 MED ORDER — METRONIDAZOLE 500 MG PO TABS: 500.0000 mg | ORAL_TABLET | Freq: Two times a day (BID) | ORAL | 0 refills | Status: DC

## 2016-10-21 NOTE — ED Provider Notes (Signed)
[facility] Emergency Department Provider Note   ____________________________________________    I have reviewed the triage vital signs and the nursing notes.   HISTORY  Chief Complaint Vaginal Discharge     HPI Shylah Dossantos is a 21 y.o. female who presents with complaints of vaginal discharge. Patient reports since the end of her period 1 week ago she has been having grayish vaginal discharge which is foul-smelling. She feels is similar to bacterial vaginosis which she has had in the past. Denies abdominal pain. No pelvic cramping. No fevers or chills. She is not concerned about STDs   Past Medical History:  Diagnosis Date  . Chlamydia   . Hypertension   . Infection    UTI  . Ovarian cyst   . Pregnancy induced hypertension     There are no active problems to display for this patient.   Past Surgical History:  Procedure Laterality Date  . CESAREAN SECTION N/A 03/21/2013   Procedure: CESAREAN SECTION;  Surgeon: Willodean Rosenthal, MD;  Location: [facility];  Service: Obstetrics;  Laterality: N/A;  . CESAREAN SECTION N/A 01/06/2016   Procedure: CESAREAN SECTION;  Surgeon: Catalina Antigua, MD;  Location: [facility];  Service: Obstetrics;  Laterality: N/A;    Prior to Admission medications   Medication Sig Start Date End Date Taking? Authorizing Provider  diphenhydramine-acetaminophen (TYLENOL PM) 25-500 MG TABS tablet Take 1 tablet by mouth at bedtime as needed (sleep).     Historical Provider, MD  doxycycline (VIBRA-TABS) 100 MG tablet Take 1 tablet (100 mg total) by mouth 2 (two) times daily. 03/22/16   Rebecka Apley, MD  hydrochlorothiazide (HYDRODIURIL) 25 MG tablet Take 1 tablet (25 mg total) by mouth daily. Patient not taking: Reported on 03/07/2016 01/08/16   Olena Leatherwood, MD  ibuprofen (ADVIL,MOTRIN) 600 MG tablet Take 1 tablet (600 mg total) by mouth every 6 (six) hours. 01/08/16   Olena Leatherwood, MD  metroNIDAZOLE (FLAGYL)  500 MG tablet Take 1 tablet (500 mg total) by mouth 2 (two) times daily after a meal. 10/21/16   Jene Every, MD  NIFEdipine (PROCARDIA-XL/ADALAT CC) 30 MG 24 hr tablet Take 1 tablet (30 mg total) by mouth daily. Patient not taking: Reported on 03/07/2016 01/09/16   Levie Heritage, DO     Allergies Patient has no known allergies.  Family History  Problem Relation Age of Onset  . Asthma Mother   . Cancer Paternal Grandfather     Social History Social History  Substance Use Topics  . Smoking status: Former Smoker    Types: Cigarettes    Quit date: 07/27/2015  . Smokeless tobacco: Never Used  . Alcohol use No    Review of Systems  Constitutional: No fever/chills  ENT: No sore throat.   Gastrointestinal: No abdominal pain.  No nausea, no vomiting.   Genitourinary: Negative for dysuria. Musculoskeletal: Negative for back pain. Skin: Negative for rash. Neurological: Negative for headaches     ____________________________________________   PHYSICAL EXAM:  VITAL SIGNS: ED Triage Vitals  Enc Vitals Group     BP 10/21/16 1812 (!) 144/84     Pulse Rate 10/21/16 1812 77     Resp 10/21/16 1812 16     Temp 10/21/16 1812 98.9 F (37.2 C)     Temp Source 10/21/16 1812 Oral     SpO2 10/21/16 1812 99 %     Weight 10/21/16 1813 208 lb (94.3 kg)     Height --  Head Circumference --      Peak Flow --      Pain Score --      Pain Loc --      Pain Edu? --      Excl. in GC? --     Constitutional: Alert and oriented. No acute distress. Pleasant and interactive Eyes: Conjunctivae are normal.  Head: Atraumatic. Nose: No congestion/rhinnorhea. Mouth/Throat: Mucous membranes are moist.   Cardiovascular: Normal rate, regular rhythm.  Respiratory: Normal respiratory effort.  No retractions. Genitourinary: Mild cervicitis, no CMT, grayish white discharge thin fishy odor  Neurologic:  Normal speech and language. No gross focal neurologic deficits are appreciated.   Skin:   Skin is warm, dry and intact. No rash noted.   ____________________________________________   LABS (all labs ordered are listed, but only abnormal results are displayed)  Labs Reviewed  WET PREP, GENITAL - Abnormal; Notable for the following:       Result Value   WBC, Wet Prep HPF POC FEW (*)    All other components within normal limits  CHLAMYDIA/NGC RT PCR ([facility] ONLY)   ____________________________________________  EKG   ____________________________________________  RADIOLOGY  None ____________________________________________   PROCEDURES  Procedure(s) performed: No    Critical Care performed: No ____________________________________________   INITIAL IMPRESSION / ASSESSMENT AND PLAN / ED COURSE  Pertinent labs & imaging results that were available during my care of the patient were reviewed by me and considered in my medical decision making (see chart for details).  Patient presents with vaginal discharge. Exam most consistent with bacterial vaginosis although she does have some mild cervicitis. We will treat with Flagyl, GC and chlamydia sent. Recommend follow-up with GYN   ____________________________________________   FINAL CLINICAL IMPRESSION(S) / ED DIAGNOSES  Final diagnoses:  Vaginal discharge      NEW MEDICATIONS STARTED DURING THIS VISIT:  Discharge Medication List as of 10/21/2016  7:31 PM    START taking these medications   Details  metroNIDAZOLE (FLAGYL) 500 MG tablet Take 1 tablet (500 mg total) by mouth 2 (two) times daily after a meal., Starting Fri 10/21/2016, Print         Note:  This document was prepared using Dragon voice recognition software and may include unintentional dictation errors.    Jene Everyobert Mashal Slavick, MD 10/21/16 [phone]

## 2016-10-21 NOTE — ED Notes (Signed)
ED Provider at bedside. 

## 2016-10-21 NOTE — ED Triage Notes (Signed)
Pt ambulatory to triage, states that she was on her cycle from 3/4-3/14, pt states that after stopping her cycle she has had grayish vaginal discharge with foul odor. Pt denies itching or burning.

## 2016-12-29 ENCOUNTER — Inpatient Hospital Stay
Admission: EM | Admit: 2016-12-29 | Discharge: 2016-12-31 | DRG: 872 | Disposition: A | Discharge status: Home / Self Care | Payer: Medicaid Other | Attending: Internal Medicine | Admitting: Internal Medicine

## 2016-12-29 ENCOUNTER — Encounter: Payer: Self-pay | Admitting: Emergency Medicine

## 2016-12-29 ENCOUNTER — Emergency Department: Payer: Medicaid Other

## 2016-12-29 DIAGNOSIS — Z79899 Other long term (current) drug therapy: Secondary | ICD-10-CM | POA: Diagnosis not present

## 2016-12-29 DIAGNOSIS — N12 Tubulo-interstitial nephritis, not specified as acute or chronic: Secondary | ICD-10-CM

## 2016-12-29 DIAGNOSIS — A419 Sepsis, unspecified organism: Secondary | ICD-10-CM | POA: Diagnosis not present

## 2016-12-29 DIAGNOSIS — E876 Hypokalemia: Secondary | ICD-10-CM | POA: Diagnosis present

## 2016-12-29 DIAGNOSIS — F17210 Nicotine dependence, cigarettes, uncomplicated: Secondary | ICD-10-CM | POA: Diagnosis present

## 2016-12-29 DIAGNOSIS — I10 Essential (primary) hypertension: Secondary | ICD-10-CM | POA: Diagnosis present

## 2016-12-29 DIAGNOSIS — N10 Acute tubulo-interstitial nephritis: Secondary | ICD-10-CM | POA: Diagnosis present

## 2016-12-29 LAB — URINALYSIS, COMPLETE (UACMP) WITH MICROSCOPIC
Bilirubin Urine: NEGATIVE
GLUCOSE, UA: NEGATIVE mg/dL
KETONES UR: 20 mg/dL — AB
NITRITE: NEGATIVE
PH: 5 (ref 5.0–8.0)
Protein, ur: 100 mg/dL — AB
SPECIFIC GRAVITY, URINE: 1.021 (ref 1.005–1.030)

## 2016-12-29 LAB — CBC
HEMATOCRIT: 41 % (ref 35.0–47.0)
HEMOGLOBIN: 14 g/dL (ref 12.0–16.0)
MCH: 29.7 pg (ref 26.0–34.0)
MCHC: 34.2 g/dL (ref 32.0–36.0)
MCV: 86.8 fL (ref 80.0–100.0)
Platelets: 344 10*3/uL (ref 150–440)
RBC: 4.72 MIL/uL (ref 3.80–5.20)
RDW: 13.2 % (ref 11.5–14.5)
WBC: 14.8 10*3/uL — ABNORMAL HIGH (ref 3.6–11.0)

## 2016-12-29 LAB — BASIC METABOLIC PANEL
ANION GAP: 11 (ref 5–15)
BUN: 10 mg/dL (ref 6–20)
CALCIUM: 9.3 mg/dL (ref 8.9–10.3)
CHLORIDE: 99 mmol/L — AB (ref 101–111)
CO2: 24 mmol/L (ref 22–32)
Creatinine, Ser: 1.31 mg/dL — ABNORMAL HIGH (ref 0.44–1.00)
GFR calc non Af Amer: 57 mL/min — ABNORMAL LOW (ref >60)
GLUCOSE: 118 mg/dL — AB (ref 65–99)
POTASSIUM: 3 mmol/L — AB (ref 3.5–5.1)
Sodium: 134 mmol/L — ABNORMAL LOW (ref 135–145)

## 2016-12-29 LAB — TROPONIN I: Troponin I: <0.03 ng/mL (ref <0.03)

## 2016-12-29 LAB — PREGNANCY, URINE: Preg Test, Ur: NEGATIVE

## 2016-12-29 LAB — LACTIC ACID, PLASMA: LACTIC ACID, VENOUS: 0.8 mmol/L (ref 0.5–1.9)

## 2016-12-29 MED ORDER — ACETAMINOPHEN 325 MG PO TABS
ORAL_TABLET | ORAL | Status: AC
  Filled 2016-12-29: qty 1

## 2016-12-29 MED ORDER — SODIUM CHLORIDE 0.9 % IV BOLUS (SEPSIS)
1000.0000 mL | Freq: Once | INTRAVENOUS | Status: AC
  Administered 2016-12-29: 1000 mL via INTRAVENOUS

## 2016-12-29 MED ORDER — DEXTROSE 5 % IV SOLN
1.0000 g | INTRAVENOUS | Status: DC
  Administered 2016-12-30: 1 g via INTRAVENOUS
  Filled 2016-12-29 (×2): qty 10

## 2016-12-29 MED ORDER — IBUPROFEN 400 MG PO TABS
400.0000 mg | ORAL_TABLET | Freq: Four times a day (QID) | ORAL | Status: DC | PRN
  Administered 2016-12-29 – 2016-12-31 (×4): 400 mg via ORAL
  Filled 2016-12-29 (×4): qty 1

## 2016-12-29 MED ORDER — HYDROCHLOROTHIAZIDE 25 MG PO TABS
25.0000 mg | ORAL_TABLET | Freq: Every day | ORAL | Status: DC
  Administered 2016-12-29: 25 mg via ORAL
  Filled 2016-12-29: qty 1

## 2016-12-29 MED ORDER — TRAMADOL HCL 50 MG PO TABS
50.0000 mg | ORAL_TABLET | Freq: Four times a day (QID) | ORAL | Status: DC | PRN
  Administered 2016-12-31: 50 mg via ORAL
  Filled 2016-12-29: qty 1

## 2016-12-29 MED ORDER — ONDANSETRON HCL 4 MG PO TABS: 4.0000 mg | ORAL_TABLET | Freq: Four times a day (QID) | ORAL | Status: DC | PRN

## 2016-12-29 MED ORDER — ACETAMINOPHEN 325 MG PO TABS
650.0000 mg | ORAL_TABLET | Freq: Four times a day (QID) | ORAL | Status: DC | PRN
  Administered 2016-12-29 – 2016-12-30 (×3): 650 mg via ORAL
  Filled 2016-12-29 (×3): qty 2

## 2016-12-29 MED ORDER — SODIUM CHLORIDE 0.9 % IV SOLN
INTRAVENOUS | Status: DC
  Administered 2016-12-29 – 2016-12-30 (×2): via INTRAVENOUS

## 2016-12-29 MED ORDER — ACETAMINOPHEN 650 MG RE SUPP: 650.0000 mg | Freq: Four times a day (QID) | RECTAL | Status: DC | PRN

## 2016-12-29 MED ORDER — ENOXAPARIN SODIUM 40 MG/0.4ML ~~LOC~~ SOLN
40.0000 mg | SUBCUTANEOUS | Status: DC
  Administered 2016-12-29 – 2016-12-30 (×2): 40 mg via SUBCUTANEOUS
  Filled 2016-12-29 (×3): qty 0.4

## 2016-12-29 MED ORDER — KETOROLAC TROMETHAMINE 30 MG/ML IJ SOLN
15.0000 mg | Freq: Once | INTRAMUSCULAR | Status: AC
  Administered 2016-12-29: 15 mg via INTRAVENOUS
  Filled 2016-12-29: qty 1

## 2016-12-29 MED ORDER — ACETAMINOPHEN 325 MG PO TABS
650.0000 mg | ORAL_TABLET | Freq: Once | ORAL | Status: AC | PRN
  Administered 2016-12-29: 650 mg via ORAL

## 2016-12-29 MED ORDER — ONDANSETRON HCL 4 MG/2ML IJ SOLN
4.0000 mg | Freq: Four times a day (QID) | INTRAMUSCULAR | Status: DC | PRN
  Administered 2016-12-30: 4 mg via INTRAVENOUS
  Filled 2016-12-29: qty 2

## 2016-12-29 MED ORDER — ACETAMINOPHEN 325 MG PO TABS
ORAL_TABLET | ORAL | Status: AC
  Filled 2016-12-29: qty 2

## 2016-12-29 MED ORDER — CEFTRIAXONE SODIUM IN DEXTROSE 20 MG/ML IV SOLN
1.0000 g | Freq: Once | INTRAVENOUS | Status: AC
  Administered 2016-12-29: 1 g via INTRAVENOUS
  Filled 2016-12-29: qty 50

## 2016-12-29 NOTE — ED Notes (Signed)
ED Provider at bedside. 

## 2016-12-29 NOTE — ED Provider Notes (Signed)
[facility] Emergency Department Provider Note  ____________________________________________  Time seen: Approximately 2:47 PM  I have reviewed the triage vital signs and the nursing notes.   HISTORY  Chief Complaint Headache and Back Pain   HPI [name] [name] is a 21 y.o. female with a history of hypertension who presents for evaluation of headache and back pain. Patient reports 3 days of right flank pain has been constant, throbbing and dull, moderate in intensity and associated with foul-smelling urine. She denies dysuria or hematuria. Also complaining of 3 days of throbbing severe, constant headache. Patient does have a history of headaches. No photophobia or phonophobia. No rashes. No nausea, vomiting, diarrhea, constipation, abdominal pain, chest pain, shortness of breath, cough, sore throat, congestion.Patient denies fever at home.  Past Medical History:  Diagnosis Date  . Chlamydia   . Hypertension   . Infection    UTI  . Ovarian cyst   . Pregnancy induced hypertension     There are no active problems to display for this patient.   Past Surgical History:  Procedure Laterality Date  . CESAREAN SECTION N/A 03/21/2013   Procedure: CESAREAN SECTION;  Surgeon: Willodean Rosenthalarolyn [name], MD;  Location: [facility];  Service: Obstetrics;  Laterality: N/A;  . CESAREAN SECTION N/A 01/06/2016   Procedure: CESAREAN SECTION;  Surgeon: Catalina AntiguaPeggy Constant, MD;  Location: [facility];  Service: Obstetrics;  Laterality: N/A;    Prior to Admission medications   Medication Sig Start Date End Date Taking? Authorizing Provider  diphenhydramine-acetaminophen (TYLENOL PM) 25-500 MG TABS tablet Take 1 tablet by mouth at bedtime as needed (sleep).    Yes [provider]  doxycycline (VIBRA-TABS) 100 MG tablet Take 1 tablet (100 mg total) by mouth 2 (two) times daily. Patient not taking: Reported on 12/29/2016 03/22/16   Rebecka ApleyWebster, Allison P, MD    hydrochlorothiazide (HYDRODIURIL) 25 MG tablet Take 1 tablet (25 mg total) by mouth daily. Patient not taking: Reported on 03/07/2016 01/08/16   Olena LeatherwoodAguilar, Kelly M, MD  ibuprofen (ADVIL,MOTRIN) 600 MG tablet Take 1 tablet (600 mg total) by mouth every 6 (six) hours. Patient not taking: Reported on 12/29/2016 01/08/16   Olena LeatherwoodAguilar, Kelly M, MD  metroNIDAZOLE (FLAGYL) 500 MG tablet Take 1 tablet (500 mg total) by mouth 2 (two) times daily after a meal. Patient not taking: Reported on 12/29/2016 10/21/16   Jene EveryKinner, Robert, MD  NIFEdipine (PROCARDIA-XL/ADALAT CC) 30 MG 24 hr tablet Take 1 tablet (30 mg total) by mouth daily. Patient not taking: Reported on 03/07/2016 01/09/16   Levie HeritageStinson, Jacob J, DO    Allergies Patient has no known allergies.  Family History  Problem Relation Age of Onset  . Asthma Mother   . Cancer Paternal Grandfather     Social History Social History  Substance Use Topics  . Smoking status: Former Smoker    Types: Cigarettes    Quit date: 07/27/2015  . Smokeless tobacco: Never Used  . Alcohol use No    Review of Systems  Constitutional: Negative for fever. Eyes: Negative for visual changes. ENT: Negative for sore throat. Neck: No neck pain  Cardiovascular: Negative for chest pain. Respiratory: Negative for shortness of breath. Gastrointestinal: Negative for abdominal pain, vomiting or diarrhea. Genitourinary: Negative for dysuria. + R flank pain and foul smeling urine Musculoskeletal: Negative for back pain. Skin: Negative for rash. Neurological: Negative for weakness or numbness. + HA Psych: No SI or HI  ____________________________________________   PHYSICAL EXAM:  VITAL SIGNS: ED Triage Vitals  Enc Vitals Group     BP 12/29/16 1303 129/80     Pulse Rate 12/29/16 1303 (!) 123     Resp 12/29/16 1303 18     Temp 12/29/16 1303 (!) 103 F (39.4 C)     Temp Source 12/29/16 1303 Oral     SpO2 12/29/16 1303 98 %     Weight 12/29/16 1240 197 lb (89.4 kg)      Height 12/29/16 1240 5\' 6"  (1.676 m)     Head Circumference --      Peak Flow --      Pain Score 12/29/16 1240 9     Pain Loc --      Pain Edu? --      Excl. in GC? --     Constitutional: Alert and oriented. Well appearing and in no apparent distress. HEENT:      Head: Normocephalic and atraumatic.         Eyes: Conjunctivae are normal. Sclera is non-icteric.       Mouth/Throat: Mucous membranes are moist.       Neck: Supple with no signs of meningismus. Cardiovascular: Tachycardic with regular rhythm. No murmurs, gallops, or rubs. 2+ symmetrical distal pulses are present in all extremities. No JVD. Respiratory: Normal respiratory effort. Lungs are clear to auscultation bilaterally. No wheezes, crackles, or rhonchi.  Gastrointestinal: Soft, non tender, and non distended with positive bowel sounds. No rebound or guarding. Genitourinary: Severe R CVA tenderness. Musculoskeletal: Nontender with normal range of motion in all extremities. No edema, cyanosis, or erythema of extremities. Neurologic: Normal speech and language. Face is symmetric. Moving all extremities. No gross focal neurologic deficits are appreciated. Skin: Skin is warm, dry and intact. No rash noted. Psychiatric: Mood and affect are normal. Speech and behavior are normal.  ____________________________________________   LABS (all labs ordered are listed, but only abnormal results are displayed)  Labs Reviewed  URINALYSIS, COMPLETE (UACMP) WITH MICROSCOPIC - Abnormal; Notable for the following:       Result Value   Color, Urine AMBER (*)    APPearance CLOUDY (*)    Hgb urine dipstick MODERATE (*)    Ketones, ur 20 (*)    Protein, ur 100 (*)    Leukocytes, UA LARGE (*)    Bacteria, UA MANY (*)    Squamous Epithelial / LPF 6-30 (*)    Non Squamous Epithelial 0-5 (*)    All other components within normal limits  BASIC METABOLIC PANEL - Abnormal; Notable for the following:    Sodium 134 (*)    Potassium 3.0 (*)     Chloride 99 (*)    Glucose, Bld 118 (*)    Creatinine, Ser 1.31 (*)    GFR calc non Af Amer 57 (*)    All other components within normal limits  CBC - Abnormal; Notable for the following:    WBC 14.8 (*)    All other components within normal limits  URINE CULTURE  CULTURE, BLOOD (ROUTINE X 2)  CULTURE, BLOOD (ROUTINE X 2)  LACTIC ACID, PLASMA  PREGNANCY, URINE  TROPONIN I  LACTIC ACID, PLASMA  POC URINE PREG, ED   ____________________________________________  EKG  ED ECG REPORT I, Nita Sickle, the attending physician, personally viewed and interpreted this ECG.  Normal sinus rhythm, rate of 97, normal intervals, normal axis, no ST elevation. St depression on inferior leads. No prior for comparison ____________________________________________  RADIOLOGY  CT renal:  1. Mild right lower pole perinephric soft tissue stranding  and fluid. This may be due to underlying pyelonephritis. 2. No urinary tract calculi or hydronephrosis. 3. 4 mm right middle lobe subpleural nodule and possible 3 mm left lower lobe nodule. These most likely represent small granulomas and do not require follow-up. ____________________________________________   PROCEDURES  Procedure(s) performed: None Procedures Critical Care performed: yes  CRITICAL CARE Performed by: Nita Sickle  ?  Total critical care time: 35 min  Critical care time was exclusive of separately billable procedures and treating other patients.  Critical care was necessary to treat or prevent imminent or life-threatening deterioration.  Critical care was time spent personally by me on the following activities: development of treatment plan with patient and/or surrogate as well as nursing, discussions with consultants, evaluation of patient's response to treatment, examination of patient, obtaining history from patient or surrogate, ordering and performing treatments and interventions, ordering and review of  laboratory studies, ordering and review of radiographic studies, pulse oximetry and re-evaluation of patient's condition.  ____________________________________________   INITIAL IMPRESSION / ASSESSMENT AND PLAN / ED COURSE  21 y.o. female with a history of hypertension who presents for evaluation of headache, R flank pain, foul smelling urine x 4 days. Patient meets sepsis criteria upon arrival to the emergency room with a fever of 103F and tachycardia. Patient has significant right flank tenderness. Differential diagnoses including pyelonephritis versus urosepsis versus infected kidney stone. Sepsis protocol initiated. IVF, tylenol, and IV rocephin ordered.  _________________________ 4:03 PM on 12/29/2016 -----------------------------------------  CT scan with no evidence of kidney stone and concerning for pyelonephritis. UA is positive for UTI. White count is 14. Patient was given Rocephin and fluids, vital signs are improved. We'll admit to the hospitalist for IV antibiotics.    Pertinent labs & imaging results that were available during my care of the patient were reviewed by me and considered in my medical decision making (see chart for details).    ____________________________________________   FINAL CLINICAL IMPRESSION(S) / ED DIAGNOSES  Final diagnoses:  Sepsis, due to unspecified organism [facility])  Pyelonephritis      NEW MEDICATIONS STARTED DURING THIS VISIT:  New Prescriptions   No medications on file     Note:  This document was prepared using Dragon voice recognition software and may include unintentional dictation errors.    Nita Sickle, MD 12/29/16 [id number]

## 2016-12-29 NOTE — H&P (Signed)
[facility] [place] at [facility]   PATIENT NAME: [name] [name]    MR#:  [id number]  DATE OF BIRTH:  12/16/1995  DATE OF ADMISSION:  12/29/2016  PRIMARY CARE PHYSICIAN: Patient, No Pcp Per   REQUESTING/REFERRING PHYSICIAN: dr Don Perkingveronese  CHIEF COMPLAINT:  Back pain HISTORY OF PRESENT ILLNESS:  [name] [name]  is a 21 y.o. female with a known history of Essential hypertension who presents with above complaint. Patient reports over the past 3 days she has had right-sided back/flank pain. She denies dysuria, frequency or urgency.  When she arrived to the emergency room her heart rate was in the 120s and temperature was 103. She is being admitted with the diagnosis of acute pyelonephritis by lab/clinical examination and CT scan.  PAST MEDICAL HISTORY:   Past Medical History:  Diagnosis Date  . Chlamydia   . Hypertension   . Infection    UTI  . Ovarian cyst   . Pregnancy induced hypertension     PAST SURGICAL HISTORY:   Past Surgical History:  Procedure Laterality Date  . CESAREAN SECTION N/A 03/21/2013   Procedure: CESAREAN SECTION;  Surgeon: Willodean Rosenthalarolyn [name], MD;  Location: [facility];  Service: Obstetrics;  Laterality: N/A;  . CESAREAN SECTION N/A 01/06/2016   Procedure: CESAREAN SECTION;  Surgeon: Catalina AntiguaPeggy Constant, MD;  Location: [facility];  Service: Obstetrics;  Laterality: N/A;    SOCIAL HISTORY:   Social History  Substance Use Topics  . Smoking status:  Smoker    Types: Cigarettes    Quit date: 07/27/2015  . Smokeless tobacco: Never Used  . Alcohol use No    FAMILY HISTORY:   Family History  Problem Relation Age of Onset  . Asthma Mother   . Cancer Paternal Grandfather     DRUG ALLERGIES:  No Known Allergies  REVIEW OF SYSTEMS:   Review of Systems  Constitutional: Positive for fever and malaise/fatigue. Negative for chills.  HENT: Negative.  Negative for ear discharge, ear pain, hearing loss, nosebleeds and sore throat.    Eyes: Negative.  Negative for blurred vision and pain.  Respiratory: Negative.  Negative for cough, hemoptysis, shortness of breath and wheezing.   Cardiovascular: Negative.  Negative for chest pain, palpitations and leg swelling.  Gastrointestinal: Negative.  Negative for abdominal pain, blood in stool, diarrhea, nausea and vomiting.  Genitourinary: Positive for flank pain. Negative for dysuria.  Musculoskeletal: Positive for back pain.  Skin: Negative.   Neurological: Positive for weakness. Negative for dizziness, tremors, speech change, focal weakness, seizures and headaches.  Endo/Heme/Allergies: Negative.  Does not bruise/bleed easily.  Psychiatric/Behavioral: Negative.  Negative for depression, hallucinations and suicidal ideas.    MEDICATIONS AT HOME:   Prior to Admission medications   Medication Sig Start Date End Date Taking? Authorizing Provider  diphenhydramine-acetaminophen (TYLENOL PM) 25-500 MG TABS tablet Take 1 tablet by mouth at bedtime as needed (sleep).    Yes [provider]  doxycycline (VIBRA-TABS) 100 MG tablet Take 1 tablet (100 mg total) by mouth 2 (two) times daily. Patient not taking: Reported on 12/29/2016 03/22/16   Rebecka ApleyWebster, Allison P, MD  hydrochlorothiazide (HYDRODIURIL) 25 MG tablet Take 1 tablet (25 mg total) by mouth daily. Patient not taking: Reported on 03/07/2016 01/08/16   Olena LeatherwoodAguilar, Kelly M, MD  ibuprofen (ADVIL,MOTRIN) 600 MG tablet Take 1 tablet (600 mg total) by mouth every 6 (six) hours. Patient not taking: Reported on 12/29/2016 01/08/16   Olena LeatherwoodAguilar, Kelly M, MD  metroNIDAZOLE (FLAGYL) 500 MG tablet Take  1 tablet (500 mg total) by mouth 2 (two) times daily after a meal. Patient not taking: Reported on 12/29/2016 10/21/16   Jene Every, MD  NIFEdipine (PROCARDIA-XL/ADALAT CC) 30 MG 24 hr tablet Take 1 tablet (30 mg total) by mouth daily. Patient not taking: Reported on 03/07/2016 01/09/16   Levie Heritage, DO      VITAL SIGNS:  Blood pressure  123/77, pulse 94, temperature 99.2 F (37.3 C), temperature source Oral, resp. rate (!) 24, height 5\' 6"  (1.676 m), weight 89.4 kg (197 lb), SpO2 99 %, not currently breastfeeding.  PHYSICAL EXAMINATION:   Physical Exam  Constitutional: She is oriented to person, place, and time and well-developed, well-nourished, and in no distress. No distress.  HENT:  Head: Normocephalic.  Eyes: No scleral icterus.  Neck: Normal range of motion. Neck supple. No JVD present. No tracheal deviation present.  Cardiovascular: Normal rate, regular rhythm and normal heart sounds.  Exam reveals no gallop and no friction rub.   No murmur heard. Pulmonary/Chest: Effort normal and breath sounds normal. No respiratory distress. She has no wheezes. She has no rales. She exhibits no tenderness.  Abdominal: Soft. Bowel sounds are normal. She exhibits no distension and no mass. There is no tenderness. There is no rebound and no guarding.  Musculoskeletal: Normal range of motion. She exhibits no edema.  Right-sided flank pain  Neurological: She is alert and oriented to person, place, and time.  Skin: Skin is warm. No rash noted. No erythema.  Psychiatric: Affect and judgment normal.      LABORATORY PANEL:   CBC  Recent Labs Lab 12/29/16 1307  WBC 14.8*  HGB 14.0  HCT 41.0  PLT 344   ------------------------------------------------------------------------------------------------------------------  Chemistries   Recent Labs Lab 12/29/16 1307  NA 134*  K 3.0*  CL 99*  CO2 24  GLUCOSE 118*  BUN 10  CREATININE 1.31*  CALCIUM 9.3   ------------------------------------------------------------------------------------------------------------------  Cardiac Enzymes  Recent Labs Lab 12/29/16 1307  TROPONINI <0.03   ------------------------------------------------------------------------------------------------------------------  RADIOLOGY:  Ct Renal Stone Study  Result Date:  12/29/2016 CLINICAL DATA:  Right flank pain for the past 3 days. Foul smelling urine. EXAM: CT ABDOMEN AND PELVIS WITHOUT CONTRAST TECHNIQUE: Multidetector CT imaging of the abdomen and pelvis was performed following the standard protocol without IV contrast. COMPARISON:  None. FINDINGS: Lower chest: Minimal bilateral dependent atelectasis. 4 mm anterior right middle lobe subpleural nodule on image number 3 of series 4. 3 mm left lower lobe nodule or focally prominent vascular branch point on image number 2 of series 4. Hepatobiliary: Mild focal fat deposition in the right lobe of the liver adjacent to the gallbladder fossa. Normal appearing gallbladder. Pancreas: Unremarkable. No pancreatic ductal dilatation or surrounding inflammatory changes. Spleen: Normal in size without focal abnormality. Adrenals/Urinary Tract: 1.2 cm mid to lower left renal cyst. Normal appearing right kidney, ureters and urinary bladder. There is mild right inferior perinephric soft tissue stranding and fluid. No urinary tract calculi or hydronephrosis. Normal appearing adrenal glands. Stomach/Bowel: Stomach is within normal limits. Appendix appears normal. No evidence of bowel wall thickening, distention, or inflammatory changes. Vascular/Lymphatic: No significant vascular findings are present. No enlarged abdominal or pelvic lymph nodes. Reproductive: Uterus and bilateral adnexa are unremarkable. Other: Minimal free peritoneal fluid in the pelvic cul-de-sac, within normal limits of physiological fluid. Tiny umbilical hernia containing fat. Musculoskeletal: Unremarkable bones. IMPRESSION: 1. Mild right lower pole perinephric soft tissue stranding and fluid. This may be due to underlying pyelonephritis. 2. No  urinary tract calculi or hydronephrosis. 3. 4 mm right middle lobe subpleural nodule and possible 3 mm left lower lobe nodule. These most likely represent small granulomas and do not require follow-up. Electronically Signed   By:  Beckie Salts M.D.   On: 12/29/2016 15:47    EKG:   Normal sinus rhythm heart rate 90 7O's 2 elevation or depression  IMPRESSION AND PLAN:   21 year old female with history of essential hypertension on hydrochlorothiazide who presents with right-sided flank pain and found to have sepsis due to acute pyelonephritis.  1. Sepsis due to Acute pyelonephritis, right side: Continue Rocephin and follow-up on final urine and blood cultures. Continue IV fluids. Follow lactic acid level. Follow WBC   2. Acute polynephritis: Continue Rocephin and follow up on urine culture.  3. Essential hypertension: Continue HCTZ  4. Hypokalemia: Replace and check magnesium level. Repeat labs in a.m.  5. Tobacco dependence: Patient is encouraged to quit smoking. Counseling was provided for 4 minutes.    All the records are reviewed and case discussed with ED provider. Management plans discussed with the patient and she is in agreement  CODE STATUS: full  TOTAL TIME TAKING CARE OF THIS PATIENT: 45 minutes.    Harrell Niehoff M.D on 12/29/2016 at 4:27 PM  Between 7am to 6pm - Pager - [phone]  After 6pm go to [email or web address] - password [email or web address]  [facility]  Office  [phone]  CC: Primary care physician; Patient, No Pcp Per

## 2016-12-29 NOTE — Progress Notes (Signed)
Notified Dr. Elpidio AnisSudini that patient has spiked another fever. MD to place new orders.

## 2016-12-29 NOTE — Progress Notes (Signed)
ANTIBIOTIC CONSULT NOTE - INITIAL  Pharmacy Consult for Rocephin Indication: urinary tract infection  No Known Allergies  Patient Measurements: Height: 5\' 6"  (167.6 cm) Weight: 197 lb (89.4 kg) IBW/kg (Calculated) : 59.3 Adjusted Body Weight:  Vital Signs: Temp: 101.9 F (38.8 C) (05/31 1810) Temp Source: Oral (05/31 1810) BP: 120/93 (05/31 1727) Pulse Rate: 97 (05/31 1727) Intake/Output from previous day: No intake/output data recorded. Intake/Output from this shift: No intake/output data recorded.  Labs:  Recent Labs  12/29/16 1307  WBC 14.8*  HGB 14.0  PLT 344  CREATININE 1.31*   Estimated Creatinine Clearance: 75.8 mL/min (A) (by C-G formula based on SCr of 1.31 mg/dL (H)). No results for input(s): VANCOTROUGH, VANCOPEAK, VANCORANDOM, GENTTROUGH, GENTPEAK, GENTRANDOM, TOBRATROUGH, TOBRAPEAK, TOBRARND, AMIKACINPEAK, AMIKACINTROU, AMIKACIN in the last 72 hours.   Microbiology: No results found for this or any previous visit (from the past 720 hour(s)).  Medical History: Past Medical History:  Diagnosis Date  . Chlamydia   . Hypertension   . Infection    UTI  . Ovarian cyst   . Pregnancy induced hypertension     Medications:   Assessment: Pharmacy to dose ceftriaxone in this 21 year old woman with suspected urinary tract infection.  Goal of Therapy:    Plan:  Will start rocephin 1 g IV q24 hours.   Barrett Goldie D 12/29/2016,6:33 PM

## 2016-12-29 NOTE — ED Triage Notes (Signed)
Pt c/o low back pain and HA x 3 days. Pt c/o generalized body aches as well. Pt c/o foul smelling urine as well. Pt is alert and oriented, NAD noted at this time.

## 2016-12-29 NOTE — ED Notes (Signed)
Lab called for results of urine preg, states they will get it done

## 2016-12-30 LAB — CBC
HCT: 37.6 % (ref 35.0–47.0)
Hemoglobin: 12.8 g/dL (ref 12.0–16.0)
MCH: 29.9 pg (ref 26.0–34.0)
MCHC: 34.2 g/dL (ref 32.0–36.0)
MCV: 87.4 fL (ref 80.0–100.0)
Platelets: 279 10*3/uL (ref 150–440)
RBC: 4.29 MIL/uL (ref 3.80–5.20)
RDW: 13.2 % (ref 11.5–14.5)
WBC: 12.1 10*3/uL — ABNORMAL HIGH (ref 3.6–11.0)

## 2016-12-30 LAB — BASIC METABOLIC PANEL
Anion gap: 8 (ref 5–15)
BUN: 5 mg/dL — AB (ref 6–20)
CHLORIDE: 105 mmol/L (ref 101–111)
CO2: 25 mmol/L (ref 22–32)
Calcium: 7.9 mg/dL — ABNORMAL LOW (ref 8.9–10.3)
Creatinine, Ser: 0.89 mg/dL (ref 0.44–1.00)
GFR calc Af Amer: >60 mL/min (ref >60)
GFR calc non Af Amer: >60 mL/min (ref >60)
GLUCOSE: 107 mg/dL — AB (ref 65–99)
POTASSIUM: 2.6 mmol/L — AB (ref 3.5–5.1)
Sodium: 138 mmol/L (ref 135–145)

## 2016-12-30 LAB — MAGNESIUM: Magnesium: 1.7 mg/dL (ref 1.7–2.4)

## 2016-12-30 MED ORDER — POTASSIUM CHLORIDE 10 MEQ/100ML IV SOLN
10.0000 meq | INTRAVENOUS | Status: AC
  Administered 2016-12-30 (×3): 10 meq via INTRAVENOUS
  Filled 2016-12-30 (×3): qty 100

## 2016-12-30 MED ORDER — POTASSIUM CHLORIDE CRYS ER 20 MEQ PO TBCR
40.0000 meq | EXTENDED_RELEASE_TABLET | ORAL | Status: AC
  Administered 2016-12-30 (×2): 40 meq via ORAL
  Filled 2016-12-30 (×2): qty 2

## 2016-12-30 MED ORDER — BOOST / RESOURCE BREEZE PO LIQD
1.0000 | Freq: Three times a day (TID) | ORAL | Status: DC
  Administered 2016-12-30 – 2016-12-31 (×2): 1 via ORAL

## 2016-12-30 NOTE — Progress Notes (Signed)
Initial Nutrition Assessment  DOCUMENTATION CODES:   Obesity unspecified  INTERVENTION:  Recommend liberalizing diet to just 2 Gram Sodium due to poor PO intake.  Encouraged intake of small, frequent meals in setting of poor appetite. Discussed snack options patient can eat here and at home.  Recommend Boost Breeze po TID, each supplement provides 250 kcal and 9 grams of protein. Recommend drinking until appetite returns to prevent loss of lean body mass.  NUTRITION DIAGNOSIS:   Inadequate oral intake related to poor appetite as evidenced by per patient/family report.  GOAL:   Patient will meet greater than or equal to 90% of their needs  MONITOR:   PO intake, Supplement acceptance, Labs, Weight trends, I & O's  REASON FOR ASSESSMENT:   Malnutrition Screening Tool    ASSESSMENT:   21 year old female with PMHx of HTN admitted with sepsis secondary to right-sided pyelonephritis.    Spoke with patient at bedside. She reports her appetite has been poor for the past 3 days. She is still attempting to eat meals but only able to finish <50% per report. Meal completion not recorded in chart. Reports recently eating only two meals per day. On her break at work she has a sandwich or cup of noodles. For her dinner she may have chicken with vegetables. Patient became tearful and reports she is having a hard time but did not go into further details. Provided emotional support and active listening. Patient reports she does not like Ensure or other milky drinks, but is amenable to trying Boost Breeze.  Reports her UBW was at least 214 lbs. Per chart patient was 219.7 lbs on 01/05/2016. She has lost 22.7 lbs (10.3% body weight) over the past year, which is not significant for time frame.  Medications reviewed and include: NS @ 100 ml/hr, ceftriaxone.  Labs reviewed: Potassium 2.6, BUN 5.  Nutrition-Focused physical exam completed. Findings are no fat depletion, no muscle depletion, and no  edema.   Patient does not meet criteria for malnutrition at this time.  Diet Order:  Diet Heart Room service appropriate? Yes; Fluid consistency: Thin  Skin:  Reviewed, no issues  Last BM:  PTA  Height:   Ht Readings from Last 1 Encounters:  12/29/16 5\' 6"  (1.676 m)    Weight:   Wt Readings from Last 1 Encounters:  12/29/16 197 lb (89.4 kg)    Ideal Body Weight:  59.1 kg  BMI:  Body mass index is 31.8 kg/m.  Estimated Nutritional Needs:   Kcal:  2000-2170 (MSJ x 1.2-1.3)  Protein:  90-107 grams (1-1.2 grams/kg)  Fluid:  2-2.2 L/day  EDUCATION NEEDS:   Education needs addressed  Helane RimaLeanne Deni Berti, MS, RD, LDN Pager: [phone] After Hours Pager: [phone]

## 2016-12-30 NOTE — Plan of Care (Signed)
Problem: Urinary Elimination: Goal: Signs and symptoms of infection will decrease Outcome: Progressing Pt is progressing toward goals, reports decreased pain, remains able to ambulate independently.

## 2016-12-30 NOTE — Progress Notes (Signed)
[facility] [place] at [facility]   PATIENT NAME: [name] [name]    MR#:  [id number]  DATE OF BIRTH:  20-Jan-1996  SUBJECTIVE: Admitted for right-sided pyelonephritis. No flank pain. Had fever and tachycardia yesterday but fever is coming down and so the heart rate.   CHIEF COMPLAINT:   Chief Complaint  Patient presents with  . Headache  . Back Pain    REVIEW OF SYSTEMS:   ROS CONSTITUTIONAL: No fever, fatigue or weakness.  EYES: No blurred or double vision.  EARS, NOSE, AND THROAT: No tinnitus or ear pain.  RESPIRATORY: No cough, shortness of breath, wheezing or hemoptysis.  CARDIOVASCULAR: No chest pain, orthopnea, edema.  GASTROINTESTINAL: No nausea, vomiting, diarrhea or abdominal pain.  GENITOURINARY: No dysuria, hematuria.  ENDOCRINE: No polyuria, nocturia,  HEMATOLOGY: No anemia, easy bruising or bleeding SKIN: No rash or lesion. MUSCULOSKELETAL: No joint pain or arthritis.   NEUROLOGIC: No tingling, numbness, weakness.  PSYCHIATRY: No anxiety or depression.   DRUG ALLERGIES:  No Known Allergies  VITALS:  Blood pressure 122/64, pulse (!) 106, temperature 99.3 F (37.4 C), temperature source Oral, resp. rate 18, height 5\' 6"  (1.676 m), weight 89.4 kg (197 lb), SpO2 92 %, not currently breastfeeding.  PHYSICAL EXAMINATION:  GENERAL:  21 y.o.-year-old patient lying in the bed with no acute distress.  EYES: Pupils equal, round, reactive to light and accommodation. No scleral icterus. Extraocular muscles intact.  HEENT: Head atraumatic, normocephalic. Oropharynx and nasopharynx clear.  NECK:  Supple, no jugular venous distention. No thyroid enlargement, no tenderness.  LUNGS: Normal breath sounds bilaterally, no wheezing, rales,rhonchi or crepitation. No use of accessory muscles of respiration.  CARDIOVASCULAR: S1, S2 normal. No murmurs, rubs, or gallops.  ABDOMEN: Soft, nontender, nondistended. Bowel sounds present. No organomegaly or mass.   EXTREMITIES: No pedal edema, cyanosis, or clubbing.  NEUROLOGIC: Cranial nerves II through XII are intact. Muscle strength 5/5 in all extremities. Sensation intact. Gait not checked.  PSYCHIATRIC: The patient is alert and oriented x 3.  SKIN: No obvious rash, lesion, or ulcer.    LABORATORY PANEL:   CBC  Recent Labs Lab 12/30/16 0325  WBC 12.1*  HGB 12.8  HCT 37.6  PLT 279   ------------------------------------------------------------------------------------------------------------------  Chemistries   Recent Labs Lab 12/30/16 0325  NA 138  K 2.6*  CL 105  CO2 25  GLUCOSE 107*  BUN 5*  CREATININE 0.89  CALCIUM 7.9*   ------------------------------------------------------------------------------------------------------------------  Cardiac Enzymes  Recent Labs Lab 12/29/16 1307  TROPONINI <0.03   ------------------------------------------------------------------------------------------------------------------  RADIOLOGY:  Ct Renal Stone Study  Result Date: 12/29/2016 CLINICAL DATA:  Right flank pain for the past 3 days. Foul smelling urine. EXAM: CT ABDOMEN AND PELVIS WITHOUT CONTRAST TECHNIQUE: Multidetector CT imaging of the abdomen and pelvis was performed following the standard protocol without IV contrast. COMPARISON:  None. FINDINGS: Lower chest: Minimal bilateral dependent atelectasis. 4 mm anterior right middle lobe subpleural nodule on image number 3 of series 4. 3 mm left lower lobe nodule or focally prominent vascular branch point on image number 2 of series 4. Hepatobiliary: Mild focal fat deposition in the right lobe of the liver adjacent to the gallbladder fossa. Normal appearing gallbladder. Pancreas: Unremarkable. No pancreatic ductal dilatation or surrounding inflammatory changes. Spleen: Normal in size without focal abnormality. Adrenals/Urinary Tract: 1.2 cm mid to lower left renal cyst. Normal appearing right kidney, ureters and urinary bladder.  There is mild right inferior perinephric soft tissue stranding and fluid. No urinary tract  calculi or hydronephrosis. Normal appearing adrenal glands. Stomach/Bowel: Stomach is within normal limits. Appendix appears normal. No evidence of bowel wall thickening, distention, or inflammatory changes. Vascular/Lymphatic: No significant vascular findings are present. No enlarged abdominal or pelvic lymph nodes. Reproductive: Uterus and bilateral adnexa are unremarkable. Other: Minimal free peritoneal fluid in the pelvic cul-de-sac, within normal limits of physiological fluid. Tiny umbilical hernia containing fat. Musculoskeletal: Unremarkable bones. IMPRESSION: 1. Mild right lower pole perinephric soft tissue stranding and fluid. This may be due to underlying pyelonephritis. 2. No urinary tract calculi or hydronephrosis. 3. 4 mm right middle lobe subpleural nodule and possible 3 mm left lower lobe nodule. These most likely represent small granulomas and do not require follow-up. Electronically Signed   By: Beckie SaltsSteven  Reid M.D.   On: 12/29/2016 15:47    EKG:   Orders placed or performed during the hospital encounter of 12/29/16  . ED EKG 12-Lead  . ED EKG 12-Lead  . EKG 12-Lead  . EKG 12-Lead    ASSESSMENT AND PLAN:   #1. Sepsis present on admission secondary to right-sided pyelonephritis: Follow urine cultures. No kidney stones. UA is abnormal on admission. Continue Rocephin, IV hydration, watch clinically. Patient had fever and tachycardia on admission. Still slightly tachycardic but fevers are coming down. #2. severe hypokalemia due to sepsis and also diuretic induced. Hold hydrocortisone is a today, replace the potassium and use hydralazine for the blood pressure while in the hospital. #3. Pregnancy-induced hypertension continued as chronic hypertension. Patient takes HCTZ at home.  Tobacco dependence: Continue to quit. Consult by admitting doctor. Likely discharge tomorrow morning Discussed with  patient's registered nurse and also the patient.    All the records are reviewed and case discussed with Care Management/Social Workerr. Management plans discussed with the patient, family and they are in agreement.  CODE STATUS: full TOTAL TIME TAKING CARE OF THIS PATIENT: 35 minutes.   POSSIBLE D/C IN 1-2DAYS, DEPENDING ON CLINICAL CONDITION.   Katha HammingKONIDENA,Zarayah Lanting M.D on 12/30/2016 at 9:31 AM  Between 7am to 6pm - Pager - [phone]  After 6pm go to [email or web address] - password [email or web address]  [facility]  Office  [phone]  CC: Primary care physician; Patient, No Pcp Per   Note: This dictation was prepared with Dragon dictation along with smaller phrase technology. Any transcriptional errors that result from this process are unintentional.

## 2016-12-30 NOTE — Progress Notes (Signed)
Lab called with a critical Potassium of 2.6. Dr called, will continue to monitor.

## 2016-12-30 NOTE — Progress Notes (Signed)
Shift assessment completed. Pt is alert and oriented, stated that she does not feel any better yet. Assessment is benign, piv #20 intact to R wrist with ivf infusing per md order. piv #20 intact to lac, both sites are free of redness and swelling. Pt denied difficulty voiding, has slight temp but room temp is 85 degrees, skin is slightly damp. Pt has call bell in reach.

## 2016-12-30 NOTE — Progress Notes (Signed)
Pt in bed with shaking chills and some nausea at 1515, received tylenol, motrin po, and zofran ivp.

## 2016-12-30 NOTE — Progress Notes (Signed)
Dr. Luberta MutterKonidena has been in on rounds.

## 2016-12-30 NOTE — Progress Notes (Signed)
Pt is feeling better, stated that her neck is now stiff and it hurts when she tries to turn it to the right or to the left. No headache. Pt's head is repositioned.

## 2016-12-31 LAB — HIV ANTIBODY (ROUTINE TESTING W REFLEX): HIV SCREEN 4TH GENERATION: NONREACTIVE

## 2016-12-31 LAB — URINE CULTURE

## 2016-12-31 LAB — POTASSIUM: POTASSIUM: 3.5 mmol/L (ref 3.5–5.1)

## 2016-12-31 MED ORDER — CEPHALEXIN 500 MG PO CAPS
500.0000 mg | ORAL_CAPSULE | Freq: Three times a day (TID) | ORAL | 0 refills | Status: AC
Start: 2016-12-31 — End: 2017-01-10

## 2016-12-31 NOTE — Plan of Care (Signed)
Problem: Urinary Elimination: Goal: Signs and symptoms of infection will decrease Outcome: Completed/Met Date Met: 12/31/16 Pt has met goals for discharge.

## 2016-12-31 NOTE — Progress Notes (Signed)
Pt dc'd at this time. Pt received d/c paperwork, including doctor's note for work and list of clinics that pt may follow up with on discharge to be rechecked. Pt received script for keflex as well. Pt escorted to front entrance of facility via wc.

## 2016-12-31 NOTE — Progress Notes (Signed)
Shift assessment completed. Pt is alert and oriented, in no distress, stated that her R flank did not hurt today, but that she continues to have a headache and neck pain. Pt says she has no history of migraines.Lungs are clear, telebox intact, SR noted. Abdomen is soft, bs heard. Pt is ambulating in room independently prn,ppp, no edema noted. piv #20 intact to R wrist with iv ns infusing per md order. piv #20 intact to lac as well. Both sites are free of redness and swelling.Pt denied difficulty voiding.

## 2016-12-31 NOTE — Discharge Instructions (Signed)
Smoking cessation  

## 2016-12-31 NOTE — Progress Notes (Signed)
[facility] - [place] at [facility]        [name] [name] was admitted to the Hospital on 12/29/2016 and Discharged  12/31/2016 and should be excused from work/school   for 7  days starting 12/29/2016 , may return to work/school without any restrictions.  [name] [name], [name] [name] M.D on 12/31/2016,at 10:57 AM  [facility] - [place] at [facility]    Office  [phone]

## 2016-12-31 NOTE — Discharge Summary (Signed)
[facility] [place] at [facility]   PATIENT NAME: [name] [name]    MR#:  [id number]  DATE OF BIRTH:  10/28/1995  DATE OF ADMISSION:  12/29/2016   ADMITTING PHYSICIAN: Adrian SaranSital Mody, MD  DATE OF DISCHARGE: 12/31/2016  PRIMARY CARE PHYSICIAN: Patient, No Pcp Per   ADMISSION DIAGNOSIS:  Pyelonephritis [N12] Sepsis, due to unspecified organism (HCC) [A41.9] DISCHARGE DIAGNOSIS:  Active Problems:   Sepsis (HCC)  SECONDARY DIAGNOSIS:   Past Medical History:  Diagnosis Date  . Chlamydia   . Hypertension   . Infection    UTI  . Ovarian cyst   . Pregnancy induced hypertension    HOSPITAL COURSE:   #1. Sepsis present on admission secondary to right-sided pyelonephritis:  E Coli per urine cultures. No kidney stones.  She has been treated with Rocephin, IV hydration. Tachycardia improved. Change to by mouth Keflex for 7 more days.   #2. severe hypokalemia due to sepsis and also diuretic induced. Hold hydrocortisone. Improved with otassium.  #3. Pregnancy-induced hypertension continued as chronic hypertension. Patient takes HCTZ at home.  Tobacco dependence: Continue to quit. Consult by admitting doctor.  DISCHARGE CONDITIONS:  Stable, discharge to home today. CONSULTS OBTAINED:   DRUG ALLERGIES:  No Known Allergies DISCHARGE MEDICATIONS:   Allergies as of 12/31/2016   No Known Allergies     Medication List    STOP taking these medications   doxycycline 100 MG tablet Commonly known as:  VIBRA-TABS   metroNIDAZOLE 500 MG tablet Commonly known as:  FLAGYL     TAKE these medications   cephALEXin 500 MG capsule Commonly known as:  KEFLEX Take 1 capsule (500 mg total) by mouth 3 (three) times daily.   diphenhydramine-acetaminophen 25-500 MG Tabs tablet Commonly known as:  TYLENOL PM Take 1 tablet by mouth at bedtime as needed (sleep).   hydrochlorothiazide 25 MG tablet Commonly known as:  HYDRODIURIL Take 1 tablet (25 mg total) by mouth  daily.   ibuprofen 600 MG tablet Commonly known as:  ADVIL,MOTRIN Take 1 tablet (600 mg total) by mouth every 6 (six) hours.   NIFEdipine 30 MG 24 hr tablet Commonly known as:  PROCARDIA-XL/ADALAT CC Take 1 tablet (30 mg total) by mouth daily.        DISCHARGE INSTRUCTIONS:  See AVS.  If you experience worsening of your admission symptoms, develop shortness of breath, life threatening emergency, suicidal or homicidal thoughts you must seek medical attention immediately by calling 911 or calling your MD immediately  if symptoms less severe.  You Must read complete instructions/literature along with all the possible adverse reactions/side effects for all the Medicines you take and that have been prescribed to you. Take any new Medicines after you have completely understood and accpet all the possible adverse reactions/side effects.   Please note  You were cared for by a hospitalist during your hospital stay. If you have any questions about your discharge medications or the care you received while you were in the hospital after you are discharged, you can call the unit and asked to speak with the hospitalist on call if the hospitalist that took care of you is not available. Once you are discharged, your primary care physician will handle any further medical issues. Please note that NO REFILLS for any discharge medications will be authorized once you are discharged, as it is imperative that you return to your primary care physician (or establish a relationship with a primary care physician if you do not have  one) for your aftercare needs so that they can reassess your need for medications and monitor your lab values.    On the day of Discharge:  VITAL SIGNS:  Blood pressure 124/69, pulse 92, temperature 99.1 F (37.3 C), temperature source Oral, resp. rate 16, height 5\' 6"  (1.676 m), weight 197 lb (89.4 kg), SpO2 100 %, not currently breastfeeding. PHYSICAL EXAMINATION:  GENERAL:  21  y.o.-year-old patient lying in the bed with no acute distress.  EYES: Pupils equal, round, reactive to light and accommodation. No scleral icterus. Extraocular muscles intact.  HEENT: Head atraumatic, normocephalic. Oropharynx and nasopharynx clear.  NECK:  Supple, no jugular venous distention. No thyroid enlargement, no tenderness.  LUNGS: Normal breath sounds bilaterally, no wheezing, rales,rhonchi or crepitation. No use of accessory muscles of respiration.  CARDIOVASCULAR: S1, S2 normal. No murmurs, rubs, or gallops.  ABDOMEN: Soft, non-tender, non-distended. Bowel sounds present. No organomegaly or mass.  EXTREMITIES: No pedal edema, cyanosis, or clubbing.  NEUROLOGIC: Cranial nerves II through XII are intact. Muscle strength 5/5 in all extremities. Sensation intact. Gait not checked.  PSYCHIATRIC: The patient is alert and oriented x 3.  SKIN: No obvious rash, lesion, or ulcer.  DATA REVIEW:   CBC  Recent Labs Lab 12/30/16 0325  WBC 12.1*  HGB 12.8  HCT 37.6  PLT 279    Chemistries   Recent Labs Lab 12/30/16 0325 12/31/16 0842  NA 138  --   K 2.6* 3.5  CL 105  --   CO2 25  --   GLUCOSE 107*  --   BUN 5*  --   CREATININE 0.89  --   CALCIUM 7.9*  --   MG 1.7  --      Microbiology Results  Results for orders placed or performed during the hospital encounter of 12/29/16  Urine C&S     Status: Abnormal   Collection Time: 12/29/16  1:06 PM  Result Value Ref Range Status   Specimen Description   Final    URINE, RANDOM Performed at [facility], [address] [place], [place] [address]    Special Requests NONE  Final   Culture >=100,000 COLONIES/mL ESCHERICHIA COLI (A)  Final   Report Status 12/31/2016 FINAL  Final   Organism ID, Bacteria ESCHERICHIA COLI (A)  Final      Susceptibility   Escherichia coli - MIC*    AMPICILLIN <=2 SENSITIVE Sensitive     CEFAZOLIN <=4 SENSITIVE Sensitive     CEFTRIAXONE <=1 SENSITIVE Sensitive     CIPROFLOXACIN <=0.25 SENSITIVE  Sensitive     GENTAMICIN <=1 SENSITIVE Sensitive     IMIPENEM <=0.25 SENSITIVE Sensitive     NITROFURANTOIN <=16 SENSITIVE Sensitive     TRIMETH/SULFA <=20 SENSITIVE Sensitive     AMPICILLIN/SULBACTAM <=2 SENSITIVE Sensitive     PIP/TAZO <=4 SENSITIVE Sensitive     Extended ESBL NEGATIVE Sensitive     * >=100,000 COLONIES/mL ESCHERICHIA COLI  Blood Culture (routine x 2)     Status: None (Preliminary result)   Collection Time: 12/29/16  1:57 PM  Result Value Ref Range Status   Specimen Description BLOOD R AC  Final   Special Requests   Final    BOTTLES DRAWN AEROBIC AND ANAEROBIC BCAV Performed at [facility], [address], [place], [place] [address]    Culture NO GROWTH 2 DAYS  Final   Report Status PENDING  Incomplete  Blood Culture (routine x 2)     Status: None (Preliminary result)  Collection Time: 12/29/16  1:58 PM  Result Value Ref Range Status   Specimen Description BLOOD R HAND  Final   Special Requests   Final    BOTTLES DRAWN AEROBIC AND ANAEROBIC BCAV Performed at [facility], [address], [place], [place] [address]    Culture NO GROWTH 2 DAYS  Final   Report Status PENDING  Incomplete    RADIOLOGY:  No results found.   Management plans discussed with the patient, family and they are in agreement.  CODE STATUS: Full Code   TOTAL TIME TAKING CARE OF THIS PATIENT: 33  minutes.    Shaune Pollack M.D on 12/31/2016 at 2:10 PM  Between 7am to 6pm - Pager - [phone]  After 6pm go to [email or web address] - Social research officer, government  [facility]  Office  [phone]  CC: Primary care physician; Patient, No Pcp Per   Note: This dictation was prepared with Dragon dictation along with smaller phrase technology. Any transcriptional errors that result from this process are unintentional.

## 2017-01-03 LAB — CULTURE, BLOOD (ROUTINE X 2)
CULTURE: NO GROWTH
Culture: NO GROWTH

## 2017-06-13 ENCOUNTER — Emergency Department
Admission: EM | Admit: 2017-06-13 | Discharge: 2017-06-13 | Disposition: A | Discharge status: Home / Self Care | Payer: 59 | Attending: Emergency Medicine | Admitting: Emergency Medicine

## 2017-06-13 ENCOUNTER — Encounter: Payer: Self-pay | Admitting: Emergency Medicine

## 2017-06-13 ENCOUNTER — Other Ambulatory Visit: Payer: Self-pay

## 2017-06-13 DIAGNOSIS — N390 Urinary tract infection, site not specified: Secondary | ICD-10-CM | POA: Diagnosis not present

## 2017-06-13 DIAGNOSIS — R300 Dysuria: Secondary | ICD-10-CM | POA: Diagnosis present

## 2017-06-13 DIAGNOSIS — I10 Essential (primary) hypertension: Secondary | ICD-10-CM | POA: Diagnosis not present

## 2017-06-13 DIAGNOSIS — F17210 Nicotine dependence, cigarettes, uncomplicated: Secondary | ICD-10-CM | POA: Insufficient documentation

## 2017-06-13 DIAGNOSIS — Z79899 Other long term (current) drug therapy: Secondary | ICD-10-CM | POA: Diagnosis not present

## 2017-06-13 LAB — URINALYSIS, COMPLETE (UACMP) WITH MICROSCOPIC
BILIRUBIN URINE: NEGATIVE
Glucose, UA: NEGATIVE mg/dL
Ketones, ur: NEGATIVE mg/dL
NITRITE: POSITIVE — AB
PH: 7 (ref 5.0–8.0)
Protein, ur: NEGATIVE mg/dL
SPECIFIC GRAVITY, URINE: 1.006 (ref 1.005–1.030)

## 2017-06-13 MED ORDER — CEPHALEXIN 500 MG PO CAPS
1000.0000 mg | ORAL_CAPSULE | Freq: Once | ORAL | Status: AC
  Administered 2017-06-13: 1000 mg via ORAL
  Filled 2017-06-13: qty 2

## 2017-06-13 MED ORDER — PHENAZOPYRIDINE HCL 95 MG PO TABS: 95.0000 mg | ORAL_TABLET | Freq: Three times a day (TID) | ORAL | 0 refills | Status: DC | PRN

## 2017-06-13 MED ORDER — CEPHALEXIN 500 MG PO CAPS: 500.0000 mg | ORAL_CAPSULE | Freq: Four times a day (QID) | ORAL | 0 refills | Status: DC

## 2017-06-13 NOTE — ED Provider Notes (Signed)
[facility] Emergency Department Provider Note  ____________________________________________  Time seen: Approximately 10:12 PM  I have reviewed the triage vital signs and the nursing notes.   HISTORY  Chief Complaint Dysuria    HPI [name] [name] is a 21 y.o. female who presents emergency department complaining of dysuria and suprapubic cramping with urination.  Patient denies any flank pain, hematuria.  She does endorse polyuria.  Patient has had a UTI in the past and states that symptoms are the same.  She denies any vaginal bleeding or discharge.  No complaint at this time.  Patient has been trying extra fluids throughout the day.  Past Medical History:  Diagnosis Date  . Chlamydia   . Hypertension   . Infection    UTI  . Ovarian cyst   . Pregnancy induced hypertension     Patient Active Problem List   Diagnosis Date Noted  . Sepsis (HCC) 12/29/2016    History reviewed. No pertinent surgical history.  Prior to Admission medications   Medication Sig Start Date End Date Taking? Authorizing Provider  cephALEXin (KEFLEX) 500 MG capsule Take 1 capsule (500 mg total) 4 (four) times daily by mouth. 06/13/17   Cuthriell, Delorise RoyalsJonathan D, PA-C  diphenhydramine-acetaminophen (TYLENOL PM) 25-500 MG TABS tablet Take 1 tablet by mouth at bedtime as needed (sleep).     [provider]  hydrochlorothiazide (HYDRODIURIL) 25 MG tablet Take 1 tablet (25 mg total) by mouth daily. 01/08/16   Olena LeatherwoodAguilar, Kelly M, MD  ibuprofen (ADVIL,MOTRIN) 600 MG tablet Take 1 tablet (600 mg total) by mouth every 6 (six) hours. Patient not taking: Reported on 12/29/2016 01/08/16   Olena LeatherwoodAguilar, Kelly M, MD  NIFEdipine (PROCARDIA-XL/ADALAT CC) 30 MG 24 hr tablet Take 1 tablet (30 mg total) by mouth daily. Patient not taking: Reported on 03/07/2016 01/09/16   Levie HeritageStinson, Jacob J, DO  phenazopyridine (PYRIDIUM) 95 MG tablet Take 1 tablet (95 mg total) 3 (three) times daily as needed by mouth for  pain. 06/13/17   Cuthriell, Delorise RoyalsJonathan D, PA-C    Allergies Patient has no known allergies.  Family History  Problem Relation Age of Onset  . Asthma Mother   . Cancer Paternal Grandfather     Social History Social History   Tobacco Use  . Smoking status: Current Every Day Smoker    Packs/day: 0.50    Types: Cigarettes  . Smokeless tobacco: Never Used  Substance Use Topics  . Alcohol use: No  . Drug use: No     Review of Systems  Constitutional: No fever/chills Cardiovascular: no chest pain. Respiratory: no cough. No SOB. Gastrointestinal: No abdominal pain.  No nausea, no vomiting.  No diarrhea.  No constipation. Genitourinary: Positive for dysuria, polyuria.  No hematuria Musculoskeletal: Negative for musculoskeletal pain. Skin: Negative for rash, abrasions, lacerations, ecchymosis. Neurological: Negative for headaches, focal weakness or numbness. 10-point ROS otherwise negative.  ____________________________________________   PHYSICAL EXAM:  VITAL SIGNS: ED Triage Vitals [06/13/17 2057]  Enc Vitals Group     BP (!) 161/95     Pulse Rate 88     Resp      Temp 98.5 F (36.9 C)     Temp Source Oral     SpO2 100 %     Weight 195 lb (88.5 kg)     Height 5\' 6"  (1.676 m)     Head Circumference      Peak Flow      Pain Score 4     Pain Loc  Pain Edu?      Excl. in GC?      Constitutional: Alert and oriented. Well appearing and in no acute distress. Eyes: Conjunctivae are normal. PERRL. EOMI. Head: Atraumatic. Neck: No stridor.    Cardiovascular: Normal rate, regular rhythm. Normal S1 and S2.  Good peripheral circulation. Respiratory: Normal respiratory effort without tachypnea or retractions. Lungs CTAB. Good air entry to the bases with no decreased or absent breath sounds. Gastrointestinal: Bowel sounds 4 quadrants. Soft and nontender to palpation. No guarding or rigidity. No palpable masses. No distention. No CVA tenderness. Musculoskeletal: Full  range of motion to all extremities. No gross deformities appreciated. Neurologic:  Normal speech and language. No gross focal neurologic deficits are appreciated.  Skin:  Skin is warm, dry and intact. No rash noted. Psychiatric: Mood and affect are normal. Speech and behavior are normal. Patient exhibits appropriate insight and judgement.   ____________________________________________   LABS (all labs ordered are listed, but only abnormal results are displayed)  Labs Reviewed  URINALYSIS, COMPLETE (UACMP) WITH MICROSCOPIC - Abnormal; Notable for the following components:      Result Value   Color, Urine STRAW (*)    APPearance HAZY (*)    Hgb urine dipstick SMALL (*)    Nitrite POSITIVE (*)    Leukocytes, UA MODERATE (*)    Bacteria, UA MANY (*)    Squamous Epithelial / LPF 0-5 (*)    All other components within normal limits  PREGNANCY, URINE   ____________________________________________  EKG   ____________________________________________  RADIOLOGY   No results found.  ____________________________________________    PROCEDURES  Procedure(s) performed:    Procedures    Medications  cephALEXin (KEFLEX) capsule 1,000 mg (not administered)     ____________________________________________   INITIAL IMPRESSION / ASSESSMENT AND PLAN / ED COURSE  Pertinent labs & imaging results that were available during my care of the patient were reviewed by me and considered in my medical decision making (see chart for details).  Review of the [place] CSRS was performed in accordance of the [facility] prior to dispensing any controlled drugs.     Patient's diagnosis is consistent with urinary tract infection.  Differential included cystitis versus kidney stone versus pyelonephritis versus UTI.  Patient's urinalysis shows nitrites plus leukocytes.  Symptoms and exam are consistent with same.. Patient will be discharged home with prescriptions for Keflex and Pyridium. Patient is to  follow up with primary care as needed or otherwise directed. Patient is given ED precautions to return to the ED for any worsening or new symptoms.     ____________________________________________  FINAL CLINICAL IMPRESSION(S) / ED DIAGNOSES  Final diagnoses:  Urinary tract infection without hematuria, site unspecified      NEW MEDICATIONS STARTED DURING THIS VISIT:  ED Discharge Orders        Ordered    cephALEXin (KEFLEX) 500 MG capsule  4 times daily     06/13/17 2218    phenazopyridine (PYRIDIUM) 95 MG tablet  3 times daily PRN     06/13/17 2218          This chart was dictated using voice recognition software/Dragon. Despite best efforts to proofread, errors can occur which can change the meaning. Any change was purely unintentional.    Racheal PatchesCuthriell, Jonathan D, PA-C 06/13/17 2220    Myrna BlazerSchaevitz, David Matthew, MD 06/13/17 2236

## 2017-06-13 NOTE — ED Triage Notes (Signed)
Pt presents to ED with c/o foul smelling urine and urinary frequency. Pt states she has a had a UTI in the past and states her symptoms are the same.  Slight lower abd pressure and pelvic discomfort. Pt denies fever or back pain. Onset of symptoms earlier today.

## 2017-06-13 NOTE — ED Notes (Signed)
Pt states urinary frequency, painful urination. States cloudy and foul smelling urine. States symptoms began today. States she has been staying hydrated. Hx UTI. Denies kidney stone hx. States feels like UTI. States last UTI wasn't treated until later so she had to be admitted for pyelo. Alert, oriented, ambulatory.

## 2017-08-01 ENCOUNTER — Encounter: Payer: Self-pay | Admitting: Emergency Medicine

## 2017-08-01 ENCOUNTER — Emergency Department
Admission: EM | Admit: 2017-08-01 | Discharge: 2017-08-01 | Disposition: A | Discharge status: Home / Self Care | Payer: Self-pay | Attending: Emergency Medicine | Admitting: Emergency Medicine

## 2017-08-01 ENCOUNTER — Other Ambulatory Visit: Payer: Self-pay

## 2017-08-01 DIAGNOSIS — F17210 Nicotine dependence, cigarettes, uncomplicated: Secondary | ICD-10-CM | POA: Insufficient documentation

## 2017-08-01 DIAGNOSIS — J40 Bronchitis, not specified as acute or chronic: Secondary | ICD-10-CM | POA: Insufficient documentation

## 2017-08-01 DIAGNOSIS — I10 Essential (primary) hypertension: Secondary | ICD-10-CM | POA: Insufficient documentation

## 2017-08-01 DIAGNOSIS — K047 Periapical abscess without sinus: Secondary | ICD-10-CM | POA: Insufficient documentation

## 2017-08-01 DIAGNOSIS — Z79899 Other long term (current) drug therapy: Secondary | ICD-10-CM | POA: Insufficient documentation

## 2017-08-01 MED ORDER — AZITHROMYCIN 250 MG PO TABS: ORAL_TABLET | ORAL | 0 refills | Status: DC

## 2017-08-01 MED ORDER — AMOXICILLIN 500 MG PO CAPS: 500.0000 mg | ORAL_CAPSULE | Freq: Three times a day (TID) | ORAL | 0 refills | Status: DC

## 2017-08-01 MED ORDER — BENZONATATE 100 MG PO CAPS: 100.0000 mg | ORAL_CAPSULE | Freq: Three times a day (TID) | ORAL | 0 refills | Status: AC | PRN

## 2017-08-01 MED ORDER — ALBUTEROL SULFATE HFA 108 (90 BASE) MCG/ACT IN AERS: 2.0000 | INHALATION_SPRAY | Freq: Four times a day (QID) | RESPIRATORY_TRACT | 0 refills | Status: DC | PRN

## 2017-08-01 NOTE — ED Notes (Signed)
See triage note  States she developed cough and chest congestion for about 1 week   Cough has been non productive  Also having some pain to right jaw area    Noticed some swelling to gland

## 2017-08-01 NOTE — ED Provider Notes (Signed)
[facility] Emergency Department Provider Note  ____________________________________________  Time seen: Approximately 4:18 PM  I have reviewed the triage vital signs and the nursing notes.   HISTORY  Chief Complaint Cough and Dental Pain    HPI [name] [name] is a 22 y.o. female that presents to the emergency department for evaluation of cough for 1 week and right-sided dental pain for several days.  She is occasionally coughing up mucus.  She has not checked her temperature but has felt warm.  She smokes 1-2 cigarettes/day.  She has not recently seen a dentist.  No cheek swelling, shortness of breath, chest pain, nausea, vomiting, abdominal pain.  Past Medical History:  Diagnosis Date  . Chlamydia   . Hypertension   . Infection    UTI  . Ovarian cyst   . Pregnancy induced hypertension     Patient Active Problem List   Diagnosis Date Noted  . Sepsis (HCC) 12/29/2016    Past Surgical History:  Procedure Laterality Date  . CESAREAN SECTION N/A 03/21/2013   Procedure: CESAREAN SECTION;  Surgeon: Willodean Rosenthal, MD;  Location: [facility] ORS;  Service: Obstetrics;  Laterality: N/A;  . CESAREAN SECTION N/A 01/06/2016   Procedure: CESAREAN SECTION;  Surgeon: Catalina Antigua, MD;  Location: [facility] BIRTHING SU[facility];  Service: Obstetrics;  Laterality: N/A;    Prior to Admission medications   Medication Sig Start Date End Date Taking? Authorizing Provider  albuterol (PROVENTIL HFA;VENTOLIN HFA) 108 (90 Base) MCG/ACT inhaler Inhale 2 puffs into the lungs every 6 (six) hours as needed for wheezing or shortness of breath. 08/01/17   Enid Derry, PA-C  amoxicillin (AMOXIL) 500 MG capsule Take 1 capsule (500 mg total) by mouth 3 (three) times daily. 08/01/17   Enid Derry, PA-C  azithromycin (ZITHROMAX Z-PAK) 250 MG tablet Take 2 tablets (500 mg) on  Day 1,  followed by 1 tablet (250 mg) once daily on Days 2 through 5. 08/01/17   Enid Derry, PA-C  benzonatate  (TESSALON PERLES) 100 MG capsule Take 1 capsule (100 mg total) by mouth 3 (three) times daily as needed for cough. 08/01/17 08/01/18  Enid Derry, PA-C  cephALEXin (KEFLEX) 500 MG capsule Take 1 capsule (500 mg total) 4 (four) times daily by mouth. 06/13/17   Cuthriell, Delorise Royals, PA-C  diphenhydramine-acetaminophen (TYLENOL PM) 25-500 MG TABS tablet Take 1 tablet by mouth at bedtime as needed (sleep).     [provider]  hydrochlorothiazide (HYDRODIURIL) 25 MG tablet Take 1 tablet (25 mg total) by mouth daily. 01/08/16   Olena Leatherwood, MD  ibuprofen (ADVIL,MOTRIN) 600 MG tablet Take 1 tablet (600 mg total) by mouth every 6 (six) hours. Patient not taking: Reported on 12/29/2016 01/08/16   Olena Leatherwood, MD  NIFEdipine (PROCARDIA-XL/ADALAT CC) 30 MG 24 hr tablet Take 1 tablet (30 mg total) by mouth daily. Patient not taking: Reported on 03/07/2016 01/09/16   Levie Heritage, DO  phenazopyridine (PYRIDIUM) 95 MG tablet Take 1 tablet (95 mg total) 3 (three) times daily as needed by mouth for pain. 06/13/17   Cuthriell, Delorise Royals, PA-C    Allergies Patient has no known allergies.  Family History  Problem Relation Age of Onset  . Asthma Mother   . Cancer Paternal Grandfather     Social History Social History   Tobacco Use  . Smoking status: Current Every Day Smoker    Packs/day: 0.50    Types: Cigarettes  . Smokeless tobacco: Never Used  Substance Use Topics  .  Alcohol use: No  . Drug use: No     Review of Systems  Eyes: No visual changes. No discharge. ENT: Negative for congestion and rhinorrhea. Cardiovascular: No chest pain. Respiratory: Positive for cough. No SOB. Gastrointestinal: No abdominal pain.  No nausea, no vomiting.  No diarrhea.  No constipation. Musculoskeletal: Negative for musculoskeletal pain. Skin: Negative for rash, abrasions, lacerations, ecchymosis. Neurological: Negative for  headaches.   ____________________________________________   PHYSICAL EXAM:  VITAL SIGNS: ED Triage Vitals  Enc Vitals Group     BP 08/01/17 1508 126/86     Pulse Rate 08/01/17 1508 82     Resp 08/01/17 1508 16     Temp 08/01/17 1508 99.1 F (37.3 C)     Temp Source 08/01/17 1508 Oral     SpO2 08/01/17 1508 100 %     Weight 08/01/17 1509 200 lb (90.7 kg)     Height 08/01/17 1509 5\' 6"  (1.676 m)     Head Circumference --      Peak Flow --      Pain Score 08/01/17 1509 8     Pain Loc --      Pain Edu? --      Excl. in GC? --      Constitutional: Alert and oriented. Well appearing and in no acute distress. Eyes: Conjunctivae are normal. PERRL. EOMI. No discharge. Head: Atraumatic. ENT: No frontal and maxillary sinus tenderness.      Ears: Tympanic membranes pearly gray with good landmarks. No discharge.      Nose: No congestion/rhinnorhea.      Mouth/Throat: Mucous membranes are moist. Oropharynx non-erythematous. Tonsils not enlarged. No exudates. Uvula midline.  Tenderness to palpation over right back molar.  No drainage from tooth.  No visible swelling. Neck: No stridor.   Hematological/Lymphatic/Immunilogical: No cervical lymphadenopathy. Cardiovascular: Normal rate, regular rhythm.  Good peripheral circulation. Respiratory: Normal respiratory effort without tachypnea or retractions. Scattered wheezes. Good air entry to the bases with no decreased or absent breath sounds. Gastrointestinal: Bowel sounds 4 quadrants. Soft and nontender to palpation. No guarding or rigidity. No palpable masses. No distention. Musculoskeletal: Full range of motion to all extremities. No gross deformities appreciated. Neurologic:  Normal speech and language. No gross focal neurologic deficits are appreciated.  Skin:  Skin is warm, dry and intact. No rash noted.   ____________________________________________   LABS (all labs ordered are listed, but only abnormal results are  displayed)  Labs Reviewed - No data to display ____________________________________________  EKG   ____________________________________________  RADIOLOGY  No results found.  ____________________________________________    PROCEDURES  Procedure(s) performed:    Procedures    Medications - No data to display   ____________________________________________   INITIAL IMPRESSION / ASSESSMENT AND PLAN / ED COURSE  Pertinent labs & imaging results that were available during my care of the patient were reviewed by me and considered in my medical decision making (see chart for details).  Review of the [place] CSRS was performed in accordance of the [facility] prior to dispensing any controlled drugs.   Patient's diagnosis is consistent with bronchitis and dental infection. Vital signs and exam are reassuring. Patient appears well and is staying well hydrated. Patient feels comfortable going home. Patient will be discharged home with prescriptions for azithromycin for bronchitis and amoxicillin for dental infection. Patient is to follow up with PCP as needed or otherwise directed. Patient is given ED precautions to return to the ED for any worsening or new symptoms.  ____________________________________________  FINAL CLINICAL IMPRESSION(S) / ED DIAGNOSES  Final diagnoses:  Dental infection  Bronchitis      NEW MEDICATIONS STARTED DURING THIS VISIT:  ED Discharge Orders        Ordered    azithromycin (ZITHROMAX Z-PAK) 250 MG tablet     08/01/17 1629    amoxicillin (AMOXIL) 500 MG capsule  3 times daily     08/01/17 1629    albuterol (PROVENTIL HFA;VENTOLIN HFA) 108 (90 Base) MCG/ACT inhaler  Every 6 hours PRN     08/01/17 1639    benzonatate (TESSALON PERLES) 100 MG capsule  3 times daily PRN     08/01/17 1639          This chart was dictated using voice recognition software/Dragon. Despite best efforts to proofread, errors can occur which can change the  meaning. Any change was purely unintentional.    Enid DerryWagner, Zyrus Hetland, PA-C 08/01/17 2038    Sharyn CreamerQuale, Mark, MD 08/01/17 [phone]

## 2017-08-01 NOTE — ED Triage Notes (Signed)
Pt reports minimally productive cough for one week. Pt reports right lower jaw pain with mild swelling noted. Ambulatory to triage. No apparent distress noted.

## 2017-08-22 IMAGING — CT CT RENAL STONE PROTOCOL
2 of 4 series · 16 of 46 positions shown, 18 images · non-contrast
Comparison: None.

CLINICAL DATA: Right flank pain for the past 3 days. Foul smelling
urine.

EXAM:
CT ABDOMEN AND PELVIS WITHOUT CONTRAST
TECHNIQUE: Multidetector CT imaging of the abdomen and pelvis was performed
following the standard protocol without IV contrast.

[Series 2: stone full standard · axial · 0.84mm/px · z∈[-502,-82]mm · 13 of 92 slices shown, 15 images]
[im 4/92  soft-tissue]
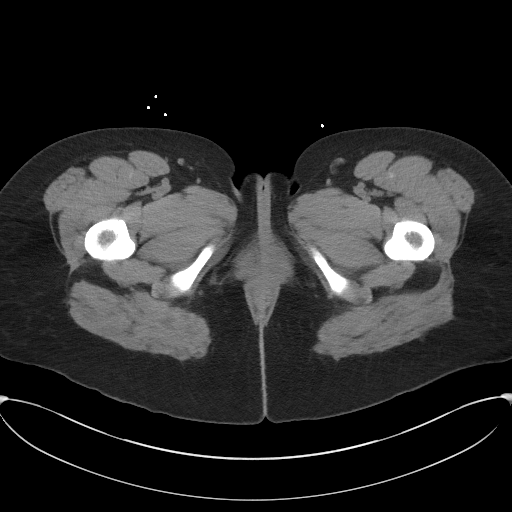
[im 4/92  bone]
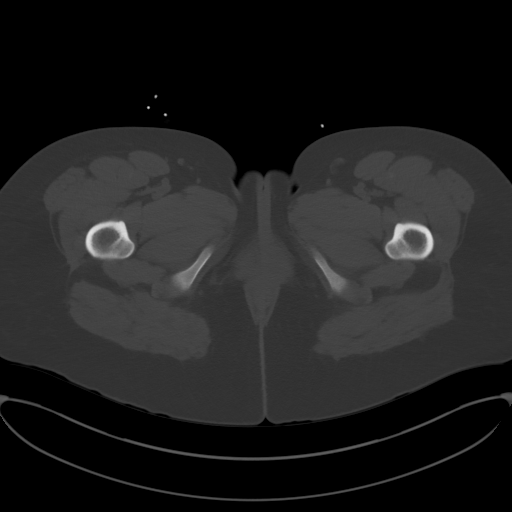
[im 11/92  soft-tissue]
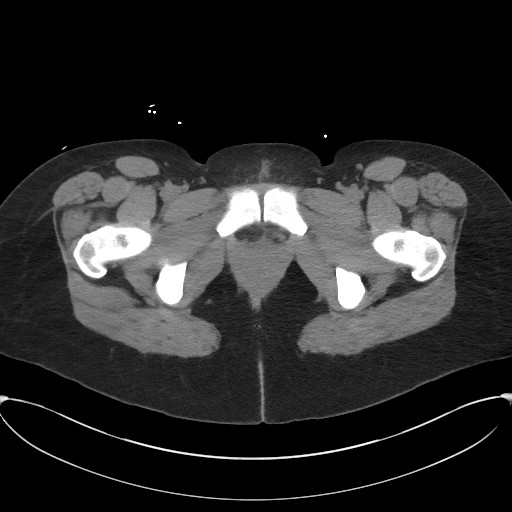
[im 19/92  soft-tissue]
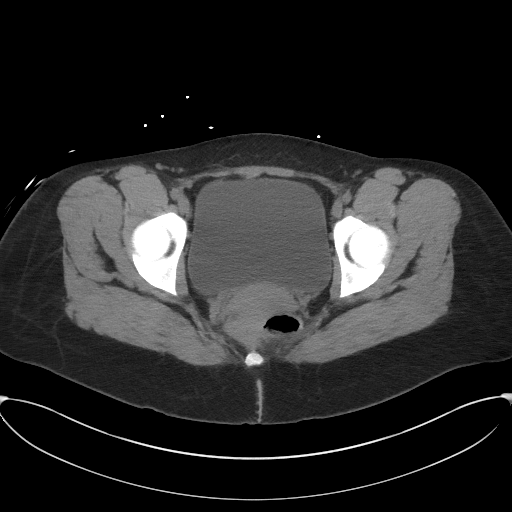
[im 26/92  soft-tissue]
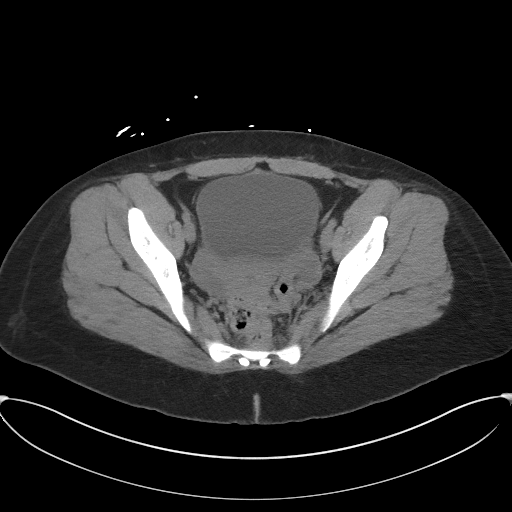
[im 33/92  soft-tissue]
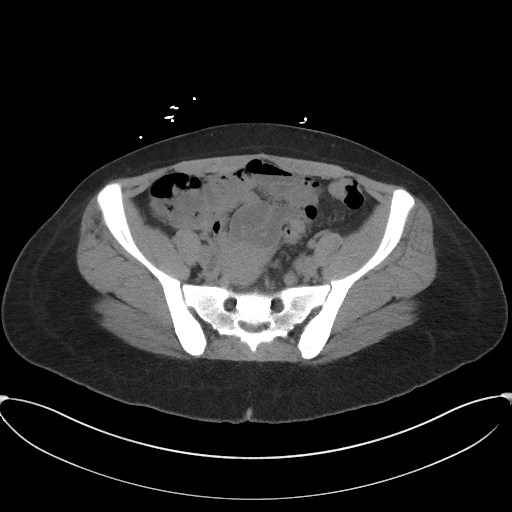
[im 41/92  soft-tissue]
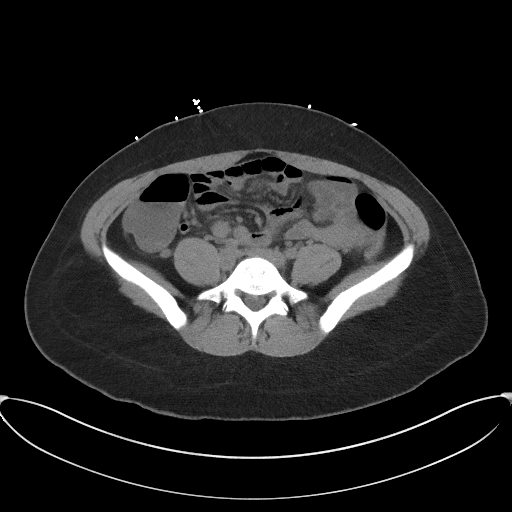
[im 48/92  soft-tissue]
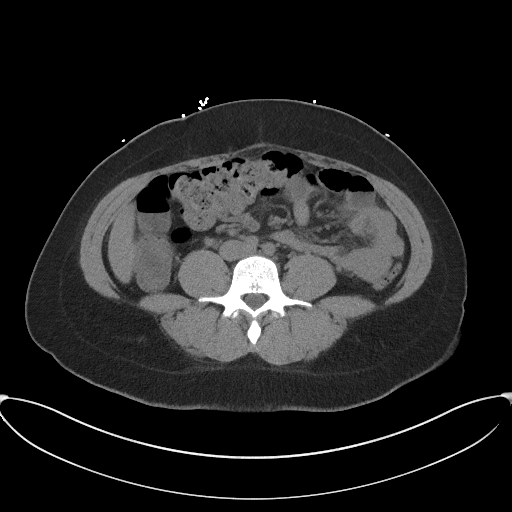
[im 51/92  soft-tissue]
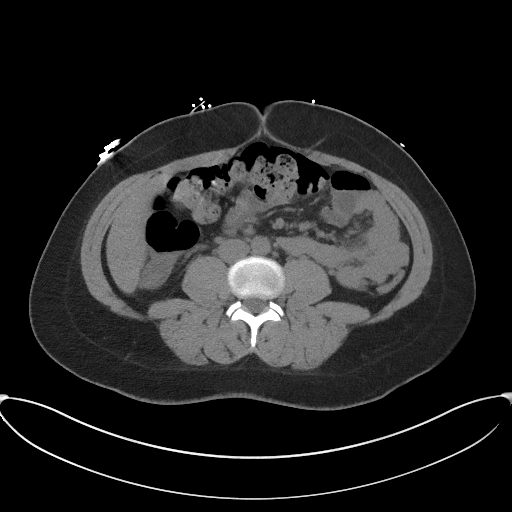
[im 59/92  soft-tissue]
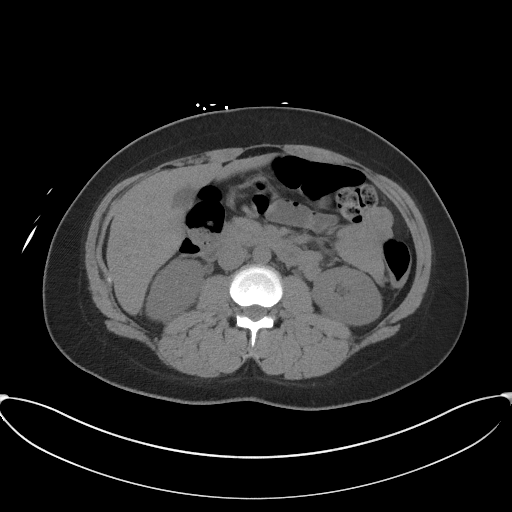
[im 59/92  bone]
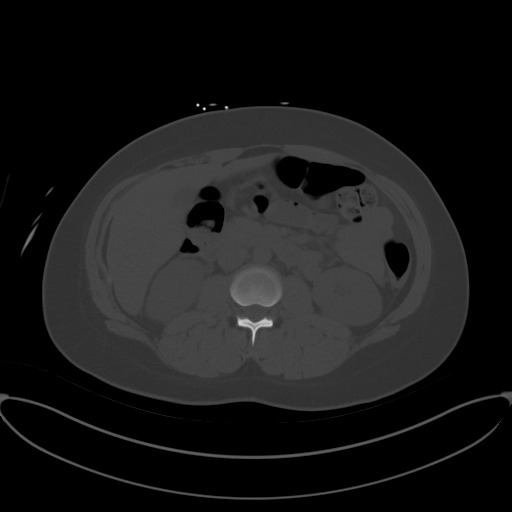
[im 66/92  soft-tissue]
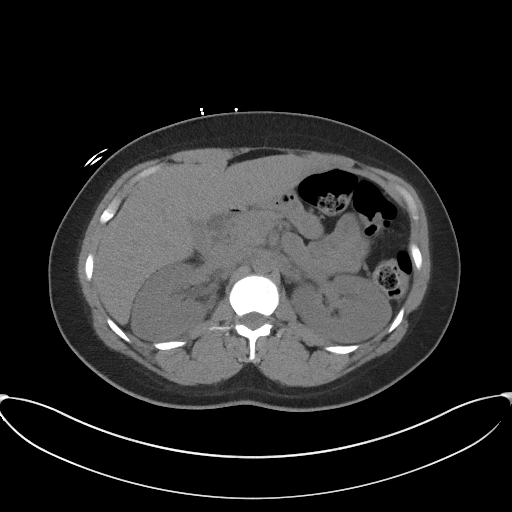
[im 73/92  soft-tissue]
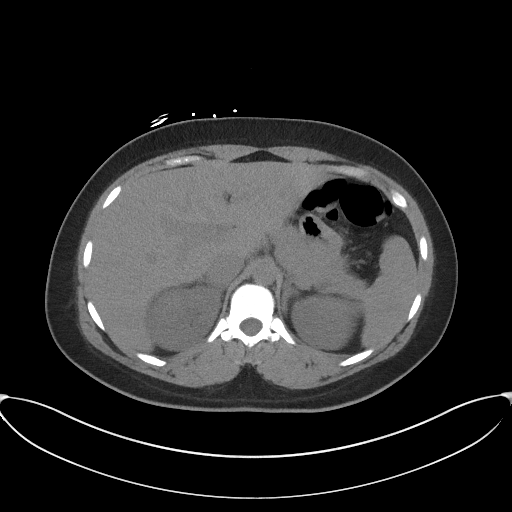
[im 81/92  soft-tissue]
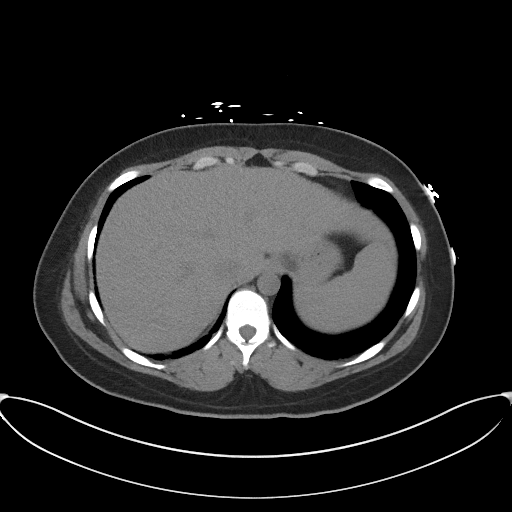
[im 88/92  soft-tissue]
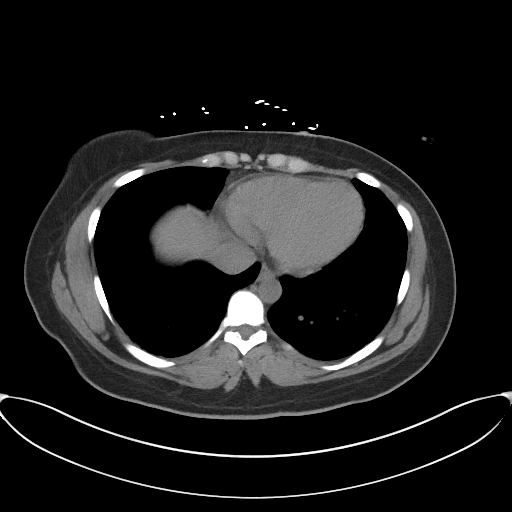

[Series 5: coronal · coronal · 0.77mm/px · 3 of 129 slices shown]
[im 43/129  soft-tissue]
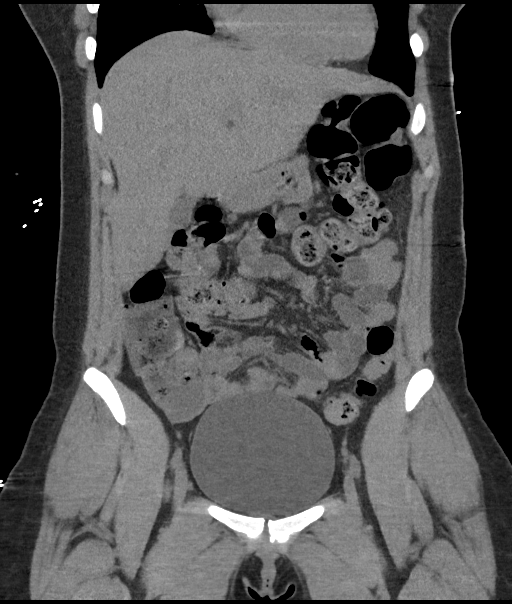
[im 57/129  soft-tissue]
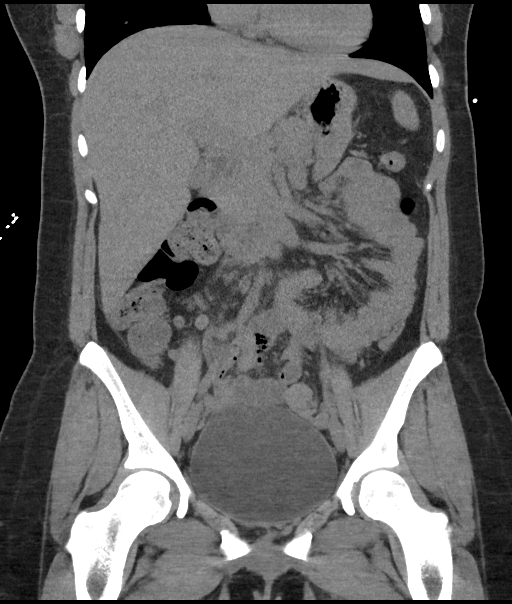
[im 72/129  soft-tissue]
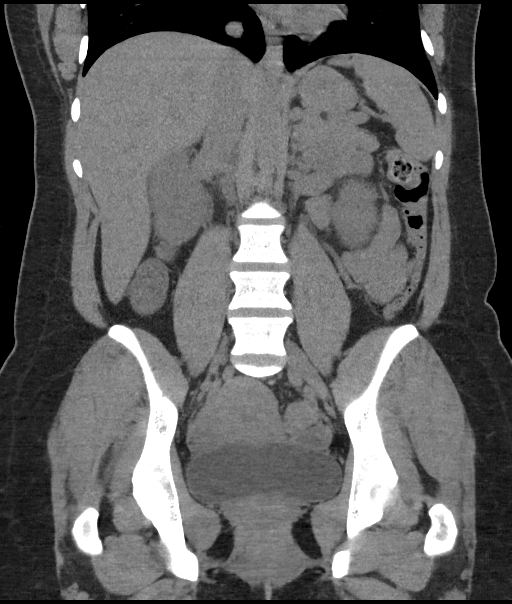

[16 of 46 positions shown; findings below may reference images not displayed]

FINDINGS: Lower chest: Minimal bilateral dependent atelectasis. 4 mm anterior
right middle lobe subpleural nodule on image number 3 of series 4. 3
mm left lower lobe nodule or focally prominent vascular branch point
on image number 2 of series 4.

Hepatobiliary: Mild focal fat deposition in the right lobe of the
liver adjacent to the gallbladder fossa. Normal appearing
gallbladder.

Pancreas: Unremarkable. No pancreatic ductal dilatation or
surrounding inflammatory changes.

Spleen: Normal in size without focal abnormality.

Adrenals/Urinary Tract: 1.2 cm mid to lower left renal cyst. Normal
appearing right kidney, ureters and urinary bladder. There is mild
right inferior perinephric soft tissue stranding and fluid. No
urinary tract calculi or hydronephrosis. Normal appearing adrenal
glands.

Stomach/Bowel: Stomach is within normal limits. Appendix appears
normal. No evidence of bowel wall thickening, distention, or
inflammatory changes.

Vascular/Lymphatic: No significant vascular findings are present. No
enlarged abdominal or pelvic lymph nodes.

Reproductive: Uterus and bilateral adnexa are unremarkable.

Other: Minimal free peritoneal fluid in the pelvic cul-de-sac,
within normal limits of physiological fluid. Tiny umbilical hernia
containing fat.

Musculoskeletal: Unremarkable bones.
IMPRESSION: 1. Mild right lower pole perinephric soft tissue stranding and
fluid. This may be due to underlying pyelonephritis.
2. No urinary tract calculi or hydronephrosis.
3. 4 mm right middle lobe subpleural nodule and possible 3 mm left
lower lobe nodule. These most likely represent small granulomas and
do not require follow-up.

## 2017-11-06 ENCOUNTER — Encounter: Payer: Self-pay | Admitting: *Deleted

## 2018-10-28 ENCOUNTER — Emergency Department
Admission: EM | Admit: 2018-10-28 | Discharge: 2018-10-28 | Disposition: A | Discharge status: Home / Self Care | Payer: Managed Care, Other (non HMO) | Attending: Emergency Medicine | Admitting: Emergency Medicine

## 2018-10-28 ENCOUNTER — Other Ambulatory Visit: Payer: Self-pay

## 2018-10-28 ENCOUNTER — Encounter: Payer: Self-pay | Admitting: Emergency Medicine

## 2018-10-28 DIAGNOSIS — F17210 Nicotine dependence, cigarettes, uncomplicated: Secondary | ICD-10-CM | POA: Insufficient documentation

## 2018-10-28 DIAGNOSIS — Z79899 Other long term (current) drug therapy: Secondary | ICD-10-CM | POA: Diagnosis not present

## 2018-10-28 DIAGNOSIS — I10 Essential (primary) hypertension: Secondary | ICD-10-CM | POA: Insufficient documentation

## 2018-10-28 DIAGNOSIS — J029 Acute pharyngitis, unspecified: Secondary | ICD-10-CM | POA: Diagnosis present

## 2018-10-28 DIAGNOSIS — J020 Streptococcal pharyngitis: Secondary | ICD-10-CM | POA: Diagnosis not present

## 2018-10-28 LAB — GROUP A STREP BY PCR: Group A Strep by PCR: DETECTED — AB

## 2018-10-28 MED ORDER — ACETAMINOPHEN 325 MG PO TABS
650.0000 mg | ORAL_TABLET | Freq: Once | ORAL | Status: AC | PRN
  Administered 2018-10-28: 650 mg via ORAL

## 2018-10-28 MED ORDER — CEPHALEXIN 500 MG PO CAPS: 500.0000 mg | ORAL_CAPSULE | Freq: Two times a day (BID) | ORAL | 0 refills | Status: DC

## 2018-10-28 MED ORDER — ACETAMINOPHEN 325 MG PO TABS
ORAL_TABLET | ORAL | Status: AC
  Filled 2018-10-28: qty 2

## 2018-10-28 MED ORDER — CEPHALEXIN 500 MG PO CAPS
500.0000 mg | ORAL_CAPSULE | Freq: Once | ORAL | Status: AC
  Administered 2018-10-28: 500 mg via ORAL
  Filled 2018-10-28: qty 1

## 2018-10-28 NOTE — ED Triage Notes (Signed)
Pt presents to ED via POV with c/o HA, sore throat, and generalized body aches. Pt states she believes that she is strep throat, pt also c/o painful swallowing.

## 2018-10-28 NOTE — ED Provider Notes (Signed)
[facility] Emergency Department Provider Note  Time seen: 2:23 PM  I have reviewed the triage vital signs and the nursing notes.   HISTORY  Chief Complaint Sore Throat; Generalized Body Aches; and Headache    HPI [name] [name] is a 23 y.o. female with a past medical history of hypertension presents to the emergency department for sore throat headache and fever.  According to the patient for the past 2 days she has been experiencing fever, headache and sore throat.  States she has had strep throat previously several years ago to which this feels identical.  Denies any cough.  No travel.   Past Medical History:  Diagnosis Date  . Chlamydia   . Hypertension   . Infection    UTI  . Ovarian cyst   . Pregnancy induced hypertension     Patient Active Problem List   Diagnosis Date Noted  . Sepsis (HCC) 12/29/2016    Past Surgical History:  Procedure Laterality Date  . CESAREAN SECTION N/A 03/21/2013   Procedure: CESAREAN SECTION;  Surgeon: Willodean Rosenthal, MD;  Location: [facility];  Service: Obstetrics;  Laterality: N/A;  . CESAREAN SECTION N/A 01/06/2016   Procedure: CESAREAN SECTION;  Surgeon: Catalina Antigua, MD;  Location: [facility];  Service: Obstetrics;  Laterality: N/A;    Prior to Admission medications   Medication Sig Start Date End Date Taking? Authorizing Provider  albuterol (PROVENTIL HFA;VENTOLIN HFA) 108 (90 Base) MCG/ACT inhaler Inhale 2 puffs into the lungs every 6 (six) hours as needed for wheezing or shortness of breath. 08/01/17   Enid Derry, PA-C  amoxicillin (AMOXIL) 500 MG capsule Take 1 capsule (500 mg total) by mouth 3 (three) times daily. 08/01/17   Enid Derry, PA-C  azithromycin (ZITHROMAX Z-PAK) 250 MG tablet Take 2 tablets (500 mg) on  Day 1,  followed by 1 tablet (250 mg) once daily on Days 2 through 5. 08/01/17   Enid Derry, PA-C  cephALEXin (KEFLEX) 500 MG capsule Take 1 capsule (500 mg total) 4 (four) times  daily by mouth. 06/13/17   Cuthriell, Delorise Royals, PA-C  diphenhydramine-acetaminophen (TYLENOL PM) 25-500 MG TABS tablet Take 1 tablet by mouth at bedtime as needed (sleep).     [provider]  hydrochlorothiazide (HYDRODIURIL) 25 MG tablet Take 1 tablet (25 mg total) by mouth daily. 01/08/16   Olena Leatherwood, MD  ibuprofen (ADVIL,MOTRIN) 600 MG tablet Take 1 tablet (600 mg total) by mouth every 6 (six) hours. Patient not taking: Reported on 12/29/2016 01/08/16   Olena Leatherwood, MD  NIFEdipine (PROCARDIA-XL/ADALAT CC) 30 MG 24 hr tablet Take 1 tablet (30 mg total) by mouth daily. Patient not taking: Reported on 03/07/2016 01/09/16   Levie Heritage, DO  phenazopyridine (PYRIDIUM) 95 MG tablet Take 1 tablet (95 mg total) 3 (three) times daily as needed by mouth for pain. 06/13/17   Cuthriell, Delorise Royals, PA-C    No Known Allergies  Family History  Problem Relation Age of Onset  . Asthma Mother   . Cancer Paternal Grandfather     Social History Social History   Tobacco Use  . Smoking status: Current Every Day Smoker    Packs/day: 0.50    Types: Cigarettes  . Smokeless tobacco: Never Used  Substance Use Topics  . Alcohol use: No  . Drug use: No    Review of Systems Constitutional: Positive for fever ENT: Positive for sore throat Cardiovascular: Negative for chest pain. Respiratory: Negative for shortness of breath.  Negative for cough. Gastrointestinal: Negative for abdominal pain, vomiting  Musculoskeletal: Negative for musculoskeletal complaints Skin: Negative for skin complaints  Neurological: Intermittent headaches. All other ROS negative  ____________________________________________   PHYSICAL EXAM:  VITAL SIGNS: ED Triage Vitals  Enc Vitals Group     BP 10/28/18 1410 (!) 149/85     Pulse Rate 10/28/18 1410 (!) 108     Resp 10/28/18 1410 18     Temp 10/28/18 1410 (!) 101.6 F (38.7 C)     Temp Source 10/28/18 1410 Oral     SpO2 10/28/18 1410 100 %      Weight 10/28/18 1401 214 lb (97.1 kg)     Height 10/28/18 1401 5\' 6"  (1.676 m)     Head Circumference --      Peak Flow --      Pain Score 10/28/18 1401 10     Pain Loc --      Pain Edu? --      Excl. in [place]? --    Constitutional: Alert and oriented. Well appearing and in no distress. Eyes: Normal exam ENT   Head: Normocephalic and atraumatic   Mouth/Throat: Mucous membranes are moist.  Patient has pharyngeal erythema with bilateral exudates.  Midline uvula. Cardiovascular: Normal rate, regular rhythm around 100 bpm.. Respiratory: Normal respiratory effort without tachypnea nor retractions. Breath sounds are clear Gastrointestinal: Soft and nontender. No distention.  Musculoskeletal: Nontender with normal range of motion in all extremities. Neurologic:  Normal speech and language. No gross focal neurologic deficits Skin:  Skin is warm, dry and intact.  Psychiatric: Mood and affect are normal.     INITIAL IMPRESSION / ASSESSMENT AND PLAN / ED COURSE  Pertinent labs & imaging results that were available during my care of the patient were reviewed by me and considered in my medical decision making (see chart for details).  Patient presents to the emergency department for sore throat fever and headache over the past several days.  On examination patient has pharyngeal erythema with bilateral exudates right greater than left.  Midline uvula.  No concern for PTA currently.  Given the patient's fever, lack of cough with bilateral exudates we will treat with antibiotics.  A strep swab has been sent as well.  Patient agreeable to plan of care.  I discussed over-the-counter Tylenol/ibuprofen for fever or discomfort.  No cough, no travel to high risk area, extremely low suspicion for coronavirus.  [name] [name] was evaluated in Emergency Department on 10/28/2018 for the symptoms described in the history of present illness. She was evaluated in the context of the global COVID-19 pandemic,  which necessitated consideration that the patient might be at risk for infection with the SARS-CoV-2 virus that causes COVID-19. Institutional protocols and algorithms that pertain to the evaluation of patients at risk for COVID-19 are in a state of rapid change based on information released by regulatory bodies including the CDC and federal and state organizations. These policies and algorithms were followed during the patient's care in the ED.     ____________________________________________   FINAL CLINICAL IMPRESSION(S) / ED DIAGNOSES  Streptococcal pharyngitis   Minna Antis, MD 10/28/18 1426

## 2019-01-07 ENCOUNTER — Other Ambulatory Visit: Payer: Self-pay

## 2019-01-07 ENCOUNTER — Encounter: Payer: Self-pay | Admitting: Emergency Medicine

## 2019-01-07 DIAGNOSIS — F17210 Nicotine dependence, cigarettes, uncomplicated: Secondary | ICD-10-CM | POA: Diagnosis not present

## 2019-01-07 DIAGNOSIS — N3000 Acute cystitis without hematuria: Secondary | ICD-10-CM | POA: Insufficient documentation

## 2019-01-07 DIAGNOSIS — I10 Essential (primary) hypertension: Secondary | ICD-10-CM | POA: Diagnosis not present

## 2019-01-07 DIAGNOSIS — Z79899 Other long term (current) drug therapy: Secondary | ICD-10-CM | POA: Diagnosis not present

## 2019-01-07 DIAGNOSIS — R300 Dysuria: Secondary | ICD-10-CM | POA: Diagnosis present

## 2019-01-07 LAB — URINALYSIS, COMPLETE (UACMP) WITH MICROSCOPIC
Bacteria, UA: NONE SEEN
Bilirubin Urine: NEGATIVE
Glucose, UA: NEGATIVE mg/dL
Ketones, ur: 5 mg/dL — AB
Nitrite: NEGATIVE
Protein, ur: 30 mg/dL — AB
RBC / HPF: >50 RBC/hpf — ABNORMAL HIGH (ref 0–5)
Specific Gravity, Urine: 1.029 (ref 1.005–1.030)
WBC, UA: >50 WBC/hpf — ABNORMAL HIGH (ref 0–5)
pH: 6 (ref 5.0–8.0)

## 2019-01-07 MED ORDER — CEPHALEXIN 500 MG PO CAPS: 1000.0000 mg | ORAL_CAPSULE | Freq: Two times a day (BID) | ORAL | 0 refills | Status: DC

## 2019-01-07 NOTE — ED Provider Notes (Signed)
[facility] Emergency Department Provider Note  ____________________________________________  Time seen: Approximately 11:49 PM  I have reviewed the triage vital signs and the nursing notes.   HISTORY  Chief Complaint Dysuria    HPI [name] [name] is a 23 y.o. female who presents the emergency department complaining of dysuria.  Patient reports that she has had 2 days of mild dysuria.  She describes as burning.  No vaginal discharge or bleeding.  No hematuria.  No flank pain.  No abdominal pain.  No fevers or chills.  Patient's only complaint is dysuria.         Past Medical History:  Diagnosis Date  . Chlamydia   . Hypertension   . Infection    UTI  . Ovarian cyst   . Pregnancy induced hypertension     Patient Active Problem List   Diagnosis Date Noted  . Sepsis (HCC) 12/29/2016    Past Surgical History:  Procedure Laterality Date  . CESAREAN SECTION N/A 03/21/2013   Procedure: CESAREAN SECTION;  Surgeon: Willodean Rosenthalarolyn [name], MD;  Location: [facility];  Service: Obstetrics;  Laterality: N/A;  . CESAREAN SECTION N/A 01/06/2016   Procedure: CESAREAN SECTION;  Surgeon: Catalina AntiguaPeggy Constant, MD;  Location: [facility];  Service: Obstetrics;  Laterality: N/A;    Prior to Admission medications   Medication Sig Start Date End Date Taking? Authorizing Provider  albuterol (PROVENTIL HFA;VENTOLIN HFA) 108 (90 Base) MCG/ACT inhaler Inhale 2 puffs into the lungs every 6 (six) hours as needed for wheezing or shortness of breath. 08/01/17   Enid DerryWagner, Ashley, PA-C  amoxicillin (AMOXIL) 500 MG capsule Take 1 capsule (500 mg total) by mouth 3 (three) times daily. 08/01/17   Enid DerryWagner, Ashley, PA-C  azithromycin (ZITHROMAX Z-PAK) 250 MG tablet Take 2 tablets (500 mg) on  Day 1,  followed by 1 tablet (250 mg) once daily on Days 2 through 5. 08/01/17   Enid DerryWagner, Ashley, PA-C  cephALEXin (KEFLEX) 500 MG capsule Take 2 capsules (1,000 mg total) by mouth 2 (two) times daily.  01/07/19   Vandana Haman, Delorise RoyalsJonathan D, PA-C  diphenhydramine-acetaminophen (TYLENOL PM) 25-500 MG TABS tablet Take 1 tablet by mouth at bedtime as needed (sleep).     [provider]  hydrochlorothiazide (HYDRODIURIL) 25 MG tablet Take 1 tablet (25 mg total) by mouth daily. 01/08/16   Olena LeatherwoodAguilar, Kelly M, MD  ibuprofen (ADVIL,MOTRIN) 600 MG tablet Take 1 tablet (600 mg total) by mouth every 6 (six) hours. Patient not taking: Reported on 12/29/2016 01/08/16   Olena LeatherwoodAguilar, Kelly M, MD  NIFEdipine (PROCARDIA-XL/ADALAT CC) 30 MG 24 hr tablet Take 1 tablet (30 mg total) by mouth daily. Patient not taking: Reported on 03/07/2016 01/09/16   Levie HeritageStinson, Jacob J, DO  phenazopyridine (PYRIDIUM) 95 MG tablet Take 1 tablet (95 mg total) 3 (three) times daily as needed by mouth for pain. 06/13/17   Minie Roadcap, Delorise RoyalsJonathan D, PA-C    Allergies Patient has no known allergies.  Family History  Problem Relation Age of Onset  . Asthma Mother   . Cancer Paternal Grandfather     Social History Social History   Tobacco Use  . Smoking status: Current Every Day Smoker    Packs/day: 0.50    Types: Cigarettes  . Smokeless tobacco: Never Used  Substance Use Topics  . Alcohol use: No  . Drug use: No     Review of Systems  Constitutional: No fever/chills Eyes: No visual changes. No discharge ENT: No upper respiratory complaints. Cardiovascular: no chest pain. Respiratory:  no cough. No SOB. Gastrointestinal: No abdominal pain.  No nausea, no vomiting.  No diarrhea.  No constipation. Genitourinary: Positive for dysuria. No hematuria Musculoskeletal: Negative for musculoskeletal pain. Skin: Negative for rash, abrasions, lacerations, ecchymosis. Neurological: Negative for headaches, focal weakness or numbness. 10-point ROS otherwise negative.  ____________________________________________   PHYSICAL EXAM:  VITAL SIGNS: ED Triage Vitals  Enc Vitals Group     BP 01/07/19 2148 (!) 153/100     Pulse Rate 01/07/19 2148  76     Resp 01/07/19 2148 18     Temp 01/07/19 2148 98.3 F (36.8 C)     Temp Source 01/07/19 2148 Oral     SpO2 01/07/19 2148 100 %     Weight 01/07/19 2154 212 lb 14.4 oz (96.6 kg)     Height 01/07/19 2149 5\' 6"  (1.676 m)     Head Circumference --      Peak Flow --      Pain Score 01/07/19 2149 0     Pain Loc --      Pain Edu? --      Excl. in [place]? --      Constitutional: Alert and oriented. Well appearing and in no acute distress. Eyes: Conjunctivae are normal. PERRL. EOMI. Head: Atraumatic. ENT:      Ears:       Nose: No congestion/rhinnorhea.      Mouth/Throat: Mucous membranes are moist.  Neck: No stridor.    Cardiovascular: Normal rate, regular rhythm. Normal S1 and S2.  Good peripheral circulation. Respiratory: Normal respiratory effort without tachypnea or retractions. Lungs CTAB. Good air entry to the bases with no decreased or absent breath sounds. Gastrointestinal: Bowel sounds 4 quadrants. Soft and nontender to palpation. No guarding or rigidity. No palpable masses. No distention. No CVA tenderness. Musculoskeletal: Full range of motion to all extremities. No gross deformities appreciated. Neurologic:  Normal speech and language. No gross focal neurologic deficits are appreciated.  Skin:  Skin is warm, dry and intact. No rash noted. Psychiatric: Mood and affect are normal. Speech and behavior are normal. Patient exhibits appropriate insight and judgement.   ____________________________________________   LABS (all labs ordered are listed, but only abnormal results are displayed)  Labs Reviewed  URINALYSIS, COMPLETE (UACMP) WITH MICROSCOPIC - Abnormal; Notable for the following components:      Result Value   Color, Urine YELLOW (*)    APPearance HAZY (*)    Hgb urine dipstick SMALL (*)    Ketones, ur 5 (*)    Protein, ur 30 (*)    Leukocytes,Ua SMALL (*)    RBC / HPF >50 (*)    WBC, UA >50 (*)    All other components within normal limits    ____________________________________________  EKG   ____________________________________________  RADIOLOGY   No results found.  ____________________________________________    PROCEDURES  Procedure(s) performed:    Procedures    Medications - No data to display   ____________________________________________   INITIAL IMPRESSION / ASSESSMENT AND PLAN / ED COURSE  Pertinent labs & imaging results that were available during my care of the patient were reviewed by me and considered in my medical decision making (see chart for details).  Review of the  [place] was performed in accordance of the [place] prior to dispensing any controlled drugs.           Patient's diagnosis is consistent with UTI.  Patient presented to the emergency department complaining of dysuria.  Patient has had urinary tract  infections in the past states that these feel similar.  No other concerning symptoms.  Physical exam reveals no acute findings.  Urinalysis with leukocytes without nitrites.  Given patient's symptoms, I will treat with antibiotics.  Patient is encouraged to drink plenty of fluids, anti-inflammatories, cranberry juice and over-the-counter Pyridium or Azo.  Patient verbalizes understanding.  Follow-up with primary care as needed.. Patient is given ED precautions to return to the ED for any worsening or new symptoms.     ____________________________________________  FINAL CLINICAL IMPRESSION(S) / ED DIAGNOSES  Final diagnoses:  Acute cystitis without hematuria      NEW MEDICATIONS STARTED DURING THIS VISIT:  ED Discharge Orders         Ordered    cephALEXin (KEFLEX) 500 MG capsule  2 times daily     01/07/19 2351              This chart was dictated using voice recognition software/Dragon. Despite best efforts to proofread, errors can occur which can change the meaning. Any change was purely unintentional.    Racheal PatchesCuthriell, Romario Tith D, PA-C 01/07/19 2357     Minna AntisPaduchowski, Kevin, MD 01/09/19 1800

## 2019-01-07 NOTE — ED Notes (Signed)
Pt seen in triage 3 by pa-c.  D/c inst to pt.

## 2019-01-07 NOTE — ED Triage Notes (Signed)
Patient ambulatory to triage with steady gait, without difficulty or distress noted; pt reports "feels weird when I pee"; denies pain however, denies vaginal discharge or any other accomp symptoms

## 2019-01-08 ENCOUNTER — Emergency Department
Admission: EM | Admit: 2019-01-08 | Discharge: 2019-01-08 | Disposition: A | Discharge status: Home / Self Care | Payer: Managed Care, Other (non HMO) | Attending: Emergency Medicine | Admitting: Emergency Medicine

## 2019-01-08 DIAGNOSIS — N3000 Acute cystitis without hematuria: Secondary | ICD-10-CM

## 2019-05-13 ENCOUNTER — Other Ambulatory Visit: Payer: Self-pay

## 2019-05-13 ENCOUNTER — Ambulatory Visit (HOSPITAL_COMMUNITY)
Admission: EM | Admit: 2019-05-13 | Discharge: 2019-05-13 | Disposition: A | Discharge status: Home / Self Care | Payer: Managed Care, Other (non HMO) | Attending: Family Medicine | Admitting: Family Medicine

## 2019-05-13 ENCOUNTER — Encounter (HOSPITAL_COMMUNITY): Payer: Self-pay

## 2019-05-13 DIAGNOSIS — Z3491 Encounter for supervision of normal pregnancy, unspecified, first trimester: Secondary | ICD-10-CM

## 2019-05-13 DIAGNOSIS — N898 Other specified noninflammatory disorders of vagina: Secondary | ICD-10-CM | POA: Insufficient documentation

## 2019-05-13 NOTE — ED Triage Notes (Signed)
Pt presents to UC w/ c/o increased white vaginal discharge x6 weeks and vaginal odor x2 weeks. Pt reports having BV in the past. Pt is currently [redacted] weeks pregnant.

## 2019-05-13 NOTE — Discharge Instructions (Signed)
Will notify you of any positive findings from your vaginal testing and if any changes to treatment are needed.   Please set up your first OB appointment.  Continue with daily prenatal vitamin.

## 2019-05-13 NOTE — ED Provider Notes (Signed)
[facility]    CSN: [id number] Arrival date & time: 05/13/19  1333      History   Chief Complaint Chief Complaint  Patient presents with  . Vaginal Discharge    HPI [name] [name] is a 23 y.o. female.   [name] Knackbony Paar presents with complaints of increased vaginal discharge for the past 8 weeks. Noticed some odor with discharge for the past two weeks. No vaginal discharge. Occasionally with pelvic cramping. No vaginal bleeding. She states she is approximately [redacted] weeks pregnant, LMP 03/18/19. She has not yet seen OB for first appointment, no U/S yet. She has had two previous pregnancies and 2 living children. She is taking a prenatal vitamin. Denies any urinary symptoms. Denies any concerns for STD's today.    ROS per HPI, negative if not otherwise mentioned.      Past Medical History:  Diagnosis Date  . Chlamydia   . Hypertension   . Infection    UTI  . Ovarian cyst   . Pregnancy induced hypertension     Patient Active Problem List   Diagnosis Date Noted  . Sepsis (HCC) 12/29/2016    Past Surgical History:  Procedure Laterality Date  . CESAREAN SECTION N/A 03/21/2013   Procedure: CESAREAN SECTION;  Surgeon: Willodean Rosenthalarolyn [name], MD;  Location: [facility];  Service: Obstetrics;  Laterality: N/A;  . CESAREAN SECTION N/A 01/06/2016   Procedure: CESAREAN SECTION;  Surgeon: Catalina AntiguaPeggy Constant, MD;  Location: [facility];  Service: Obstetrics;  Laterality: N/A;    OB History    Gravida  3   Para  2   Term  0   Preterm  2   AB  0   Living  2     SAB  0   TAB  0   Ectopic  0   Multiple  0   Live Births  2            Home Medications    Prior to Admission medications   Medication Sig Start Date End Date Taking? Authorizing Provider  Prenatal Vit-Fe Fumarate-FA (PRENATAL MULTIVITAMIN) TABS tablet Take 1 tablet by mouth daily at 12 noon.   Yes [provider]  albuterol (PROVENTIL HFA;VENTOLIN HFA) 108 (90 Base) MCG/ACT  inhaler Inhale 2 puffs into the lungs every 6 (six) hours as needed for wheezing or shortness of breath. 08/01/17   Enid DerryWagner, Ashley, PA-C  amoxicillin (AMOXIL) 500 MG capsule Take 1 capsule (500 mg total) by mouth 3 (three) times daily. 08/01/17   Enid DerryWagner, Ashley, PA-C  azithromycin (ZITHROMAX Z-PAK) 250 MG tablet Take 2 tablets (500 mg) on  Day 1,  followed by 1 tablet (250 mg) once daily on Days 2 through 5. 08/01/17   Enid DerryWagner, Ashley, PA-C  cephALEXin (KEFLEX) 500 MG capsule Take 2 capsules (1,000 mg total) by mouth 2 (two) times daily. 01/07/19   Cuthriell, Delorise RoyalsJonathan D, PA-C  diphenhydramine-acetaminophen (TYLENOL PM) 25-500 MG TABS tablet Take 1 tablet by mouth at bedtime as needed (sleep).     [provider]  hydrochlorothiazide (HYDRODIURIL) 25 MG tablet Take 1 tablet (25 mg total) by mouth daily. 01/08/16   Olena LeatherwoodAguilar, Kelly M, MD  ibuprofen (ADVIL,MOTRIN) 600 MG tablet Take 1 tablet (600 mg total) by mouth every 6 (six) hours. Patient not taking: Reported on 12/29/2016 01/08/16   Olena LeatherwoodAguilar, Kelly M, MD  NIFEdipine (PROCARDIA-XL/ADALAT CC) 30 MG 24 hr tablet Take 1 tablet (30 mg total) by mouth daily. Patient not taking: Reported on 03/07/2016 01/09/16  Levie Heritage, DO  phenazopyridine (PYRIDIUM) 95 MG tablet Take 1 tablet (95 mg total) 3 (three) times daily as needed by mouth for pain. 06/13/17   Cuthriell, Delorise Royals, PA-C    Family History Family History  Problem Relation Age of Onset  . Asthma Mother   . Cancer Paternal Grandfather     Social History Social History   Tobacco Use  . Smoking status: Current Every Day Smoker    Packs/day: 0.50    Types: Cigarettes  . Smokeless tobacco: Never Used  Substance Use Topics  . Alcohol use: No  . Drug use: No     Allergies   Shellfish allergy   Review of Systems Review of Systems   Physical Exam Triage Vital Signs ED Triage Vitals  Enc Vitals Group     BP 05/13/19 1354 (!) 131/98     Pulse Rate 05/13/19 1346 85     Resp  05/13/19 1346 16     Temp 05/13/19 1355 98.9 F (37.2 C)     Temp Source 05/13/19 1355 Oral     SpO2 05/13/19 1346 98 %     Weight --      Height --      Head Circumference --      Peak Flow --      Pain Score 05/13/19 1349 0     Pain Loc --      Pain Edu? --      Excl. in GC? --    No data found.  Updated Vital Signs BP (!) 131/98 (BP Location: Right Arm)   Pulse 85   Temp 98.9 F (37.2 C) (Oral)   Resp 16   LMP 01/07/2019 (Exact Date)   SpO2 98%    Physical Exam Constitutional:      General: She is not in acute distress.    Appearance: She is well-developed.  Cardiovascular:     Rate and Rhythm: Normal rate.  Pulmonary:     Effort: Pulmonary effort is normal.  Abdominal:     Palpations: Abdomen is soft. Abdomen is not rigid.     Tenderness: There is no abdominal tenderness. There is no guarding or rebound.  Genitourinary:    Comments: Denies sores, lesions, vaginal bleeding; no pelvic pain; gu exam deferred at this time, vaginal self swab collected.   Skin:    General: Skin is warm and dry.  Neurological:     Mental Status: She is alert and oriented to person, place, and time.      UC Treatments / Results  Labs (all labs ordered are listed, but only abnormal results are displayed) Labs Reviewed  CERVICOVAGINAL ANCILLARY ONLY    EKG   Radiology No results found.  Procedures Procedures (including critical care time)  Medications Ordered in UC Medications - No data to display  Initial Impression / Assessment and Plan / UC Course  I have reviewed the triage vital signs and the nursing notes.  Pertinent labs & imaging results that were available during my care of the patient were reviewed by me and considered in my medical decision making (see chart for details).     Will await final results of vaginal cytology before initiating treatment in this pregnant patient. Encouraged follow up with OB for first appointment. Return precautions provided.  Patient verbalized understanding and agreeable to plan.   Final Clinical Impressions(s) / UC Diagnoses   Final diagnoses:  Vaginal discharge  Pregnant and not yet delivered in first trimester  Discharge Instructions     Will notify you of any positive findings from your vaginal testing and if any changes to treatment are needed.   Please set up your first OB appointment.  Continue with daily prenatal vitamin.    ED Prescriptions    None     PDMP not reviewed this encounter.   Zigmund Gottron, NP 05/13/19 1457

## 2019-05-15 LAB — CERVICOVAGINAL ANCILLARY ONLY
Bacterial vaginitis: POSITIVE — AB
Candida vaginitis: NEGATIVE
Chlamydia: NEGATIVE
Neisseria Gonorrhea: NEGATIVE
Trichomonas: NEGATIVE

## 2019-05-16 ENCOUNTER — Encounter: Payer: Self-pay | Admitting: General Practice

## 2019-05-16 ENCOUNTER — Telehealth (HOSPITAL_COMMUNITY): Payer: Self-pay | Admitting: Emergency Medicine

## 2019-05-16 MED ORDER — CLINDAMYCIN HCL 150 MG PO CAPS: 300.0000 mg | ORAL_CAPSULE | Freq: Two times a day (BID) | ORAL | 0 refills | Status: AC

## 2019-05-16 NOTE — Telephone Encounter (Addendum)
Bacterial vaginosis is positive. This was not treated at the urgent care visit.  Clindamycin 300mg  BID x7 days per Dr. Mannie Stabile.  no refills sent to patients pharmacy of choice.    Patient contacted and made aware of    results, all questions answered

## 2019-05-16 NOTE — Addendum Note (Signed)
Addended by: Sandria Manly on: 05/16/2019 10:48 AM   Modules accepted: Orders

## 2019-05-28 ENCOUNTER — Other Ambulatory Visit: Payer: Self-pay

## 2019-05-28 ENCOUNTER — Ambulatory Visit (INDEPENDENT_AMBULATORY_CARE_PROVIDER_SITE_OTHER): Payer: Managed Care, Other (non HMO) | Admitting: *Deleted

## 2019-05-28 DIAGNOSIS — O0990 Supervision of high risk pregnancy, unspecified, unspecified trimester: Secondary | ICD-10-CM

## 2019-05-28 NOTE — Progress Notes (Signed)
  Virtual Visit via Telephone Note  I connected with [name] [name] on 05/28/19 at  8:30 AM EDT by telephone and verified that I am speaking with the correct person using two identifiers.  Location: Patient: [name] [name] MRN: [id number] Provider: Derl Barrow, RN   I discussed the limitations, risks, security and privacy concerns of performing an evaluation and management service by telephone and the availability of in person appointments. I also discussed with the patient that there may be a patient responsible charge related to this service. The patient expressed understanding and agreed to proceed.   History of Present Illness: PRENATAL INTAKE SUMMARY  [name] [name] presents today New OB Nurse Interview.  OB History    Gravida  3   Para  2   Term  0   Preterm  2   AB  0   Living  2     SAB  0   TAB  0   Ectopic  0   Multiple  0   Live Births  2          I have reviewed the patient's medical, obstetrical, social, and family histories, medications, and available lab results.  SUBJECTIVE She has no unusual complaints. Patient has history of 2 previous induced c-section; delivering before [redacted] weeks gestation. Both pregnancies, low birth weight of 3 lbs. Pt reported history of pre-eclampsia with both pregnancies.   Observations/Objective: Initial nurse interview for history/labs (New OB)  EDD: 12/23/2019 by LMP GA: [redacted]w[redacted]d [id number] FHT: non face to face interview  GENERAL APPEARANCE: non face to face interview  Assessment and Plan: Normal pregnancy Prenatal care-[facility] Patient will have first initial visit at [facility] and then transfer care to [facility]. Labs/physical to completed at next visit   Follow Up Instructions:   I discussed the assessment and treatment plan with the patient. The patient was provided an opportunity to ask questions and all were answered. The patient agreed with the plan and demonstrated an understanding of the instructions.    The patient was advised to call back or seek an in-person evaluation if the symptoms worsen or if the condition fails to improve as anticipated.  I provided 20 minutes of non-face-to-face time during this encounter.   Derl Barrow, RN

## 2019-06-07 ENCOUNTER — Other Ambulatory Visit (HOSPITAL_COMMUNITY)
Admission: RE | Admit: 2019-06-07 | Discharge: 2019-06-07 | Disposition: A | Discharge status: Home / Self Care | Payer: Managed Care, Other (non HMO) | Source: Ambulatory Visit | Attending: Advanced Practice Midwife | Admitting: Advanced Practice Midwife

## 2019-06-07 ENCOUNTER — Ambulatory Visit (INDEPENDENT_AMBULATORY_CARE_PROVIDER_SITE_OTHER): Payer: Managed Care, Other (non HMO) | Admitting: Advanced Practice Midwife

## 2019-06-07 ENCOUNTER — Encounter: Payer: Managed Care, Other (non HMO) | Admitting: Advanced Practice Midwife

## 2019-06-07 ENCOUNTER — Other Ambulatory Visit: Payer: Self-pay

## 2019-06-07 ENCOUNTER — Encounter: Payer: Self-pay | Admitting: Advanced Practice Midwife

## 2019-06-07 VITALS — BP 134/87 | HR 88 | Temp 98.7°F | Wt 208.0 lb

## 2019-06-07 DIAGNOSIS — O0990 Supervision of high risk pregnancy, unspecified, unspecified trimester: Secondary | ICD-10-CM | POA: Insufficient documentation

## 2019-06-07 DIAGNOSIS — O0991 Supervision of high risk pregnancy, unspecified, first trimester: Secondary | ICD-10-CM

## 2019-06-07 DIAGNOSIS — O10919 Unspecified pre-existing hypertension complicating pregnancy, unspecified trimester: Secondary | ICD-10-CM | POA: Insufficient documentation

## 2019-06-07 DIAGNOSIS — Z3A11 11 weeks gestation of pregnancy: Secondary | ICD-10-CM

## 2019-06-07 DIAGNOSIS — O09299 Supervision of pregnancy with other poor reproductive or obstetric history, unspecified trimester: Secondary | ICD-10-CM

## 2019-06-07 DIAGNOSIS — O09291 Supervision of pregnancy with other poor reproductive or obstetric history, first trimester: Secondary | ICD-10-CM

## 2019-06-07 DIAGNOSIS — O10911 Unspecified pre-existing hypertension complicating pregnancy, first trimester: Secondary | ICD-10-CM

## 2019-06-07 MED ORDER — ASPIRIN EC 81 MG PO TBEC: 81.0000 mg | DELAYED_RELEASE_TABLET | Freq: Every day | ORAL | 3 refills | Status: DC

## 2019-06-07 MED ORDER — LABETALOL HCL 200 MG PO TABS: 200.0000 mg | ORAL_TABLET | Freq: Two times a day (BID) | ORAL | 3 refills | Status: DC

## 2019-06-07 NOTE — Progress Notes (Signed)
Subjective:   [name] [name] is a 23 y.o. [id number] at 49w4dby LMP being seen today for her first obstetrical visit.  Her obstetrical history is significant for pre-eclampsia and CHTN. Prior c-section x 2, plans repeat. Patient does intend to breast feed. Pregnancy history fully reviewed.  Patient reports no complaints.  HISTORY: OB History  Gravida Para Term Preterm AB Living  3 2 0 2 0 2  SAB TAB Ectopic Multiple Live Births  0 0 0 0 2    # Outcome Date GA Lbr Len/2nd Weight Sex Delivery Anes PTL Lv  3 Current           2 Preterm 01/06/16 379w6d3 lb 13.7 oz (1.75 kg) M CS-LTranv Spinal  LIV     Name: PITTS,AYREN MARRELL     Apgar1: 8  Apgar5: 9  1 Preterm 03/21/13 3265w5d lb 1 oz (1.389 kg) F CS-LTranv Spinal  LIV     Name: Ocon,GIRL Loralai     Apgar1: 9  Apgar5: 9    Last pap smear was done unsure and was unsure   Past Medical History:  Diagnosis Date  . Chlamydia   . Hypertension   . Infection    UTI  . Ovarian cyst   . Pregnancy induced hypertension    Past Surgical History:  Procedure Laterality Date  . CESAREAN SECTION N/A 03/21/2013   Procedure: CESAREAN SECTION;  Surgeon: CarLavonia DraftsD;  [facility];  Service: Obstetrics;  Laterality: N/A;  . CESAREAN SECTION N/A 01/06/2016   Procedure: CESAREAN SECTION;  Surgeon: PegMora BellmanD;  [facility];  Laterality: N/A;   Family History  Problem Relation Age of Onset  . Asthma Mother   . Cancer Paternal Grandfather    Social History   Tobacco Use  . Smoking status: Former Smoker    Packs/day: 0.50    Types: Cigarettes  . Smokeless tobacco: Never Used  . Tobacco comment: Stopped when she found out she was pregnant  Substance Use Topics  . Alcohol use: No  . Drug use: No   Allergies  Allergen Reactions  . Shellfish Allergy    Current Outpatient Medications on File Prior to Visit  Medication Sig Dispense Refill  . albuterol (PROVENTIL  HFA;VENTOLIN HFA) 108 (90 Base) MCG/ACT inhaler Inhale 2 puffs into the lungs every 6 (six) hours as needed for wheezing or shortness of breath. (Patient not taking: Reported on 05/28/2019) 1 Inhaler 0  . amoxicillin (AMOXIL) 500 MG capsule Take 1 capsule (500 mg total) by mouth 3 (three) times daily. (Patient not taking: Reported on 05/28/2019) 30 capsule 0  . azithromycin (ZITHROMAX Z-PAK) 250 MG tablet Take 2 tablets (500 mg) on  Day 1,  followed by 1 tablet (250 mg) once daily on Days 2 through 5. (Patient not taking: Reported on 05/28/2019) 6 each 0  . cephALEXin (KEFLEX) 500 MG capsule Take 2 capsules (1,000 mg total) by mouth 2 (two) times daily. (Patient not taking: Reported on 05/28/2019) 28 capsule 0  . clindamycin (CLEOCIN) 150 MG capsule Take 150 mg by mouth 2 (two) times daily.    . diphenhydramine-acetaminophen (TYLENOL PM) 25-500 MG TABS tablet Take 1 tablet by mouth at bedtime as needed (sleep).     . hydrochlorothiazide (HYDRODIURIL) 25 MG tablet Take 1 tablet (25 mg total) by mouth daily. (Patient not taking: Reported on 05/28/2019) 30 tablet 3  . ibuprofen (ADVIL,MOTRIN) 600 MG tablet Take 1 tablet (600 mg  total) by mouth every 6 (six) hours. (Patient not taking: Reported on 12/29/2016) 30 tablet 0  . NIFEdipine (PROCARDIA-XL/ADALAT CC) 30 MG 24 hr tablet Take 1 tablet (30 mg total) by mouth daily. (Patient not taking: Reported on 03/07/2016) 30 tablet 2  . phenazopyridine (PYRIDIUM) 95 MG tablet Take 1 tablet (95 mg total) 3 (three) times daily as needed by mouth for pain. (Patient not taking: Reported on 05/28/2019) 6 tablet 0  . Prenatal Vit-Fe Fumarate-FA (PRENATAL MULTIVITAMIN) TABS tablet Take 1 tablet by mouth daily at 12 noon.     No current facility-administered medications on file prior to visit.     Review of Systems Pertinent items noted in HPI and remainder of comprehensive ROS otherwise negative.  Exam   Vitals:   06/07/19 0836 06/07/19 0841  BP: (!) 147/96 134/87   Pulse: (!) 118 88  Temp: 98.7 F (37.1 C)   Weight: 208 lb (94.3 kg)    Fetal Heart Rate (bpm): 164  Physical Exam  Constitutional: She is oriented to person, place, and time and well-developed, well-nourished, and in no distress. No distress.  HENT:  Head: Normocephalic.  Cardiovascular: Normal rate.  Pulmonary/Chest: Effort normal.  Abdominal: Soft. There is no abdominal tenderness.  Genitourinary:    Genitourinary Comments:  External: no lesion Vagina: small amount of white discharge Cervix: pink, smooth, no CMT Uterus: AGA    Neurological: She is alert and oriented to person, place, and time.  Skin: Skin is warm and dry.  Psychiatric: Affect normal.  Nursing note and vitals reviewed.  FHT 164 with doppler   Assessment:   Pregnancy: [id number] Patient Active Problem List   Diagnosis Date Noted  . Chronic hypertension in pregnancy 06/07/2019  . Supervision of high risk pregnancy, antepartum 05/28/2019  . Sepsis ([place]) 12/29/2016     Plan:  1. Supervision of high risk pregnancy, antepartum  - Obstetric Panel, Including HIV - Protein / creatinine ratio, urine - Comp Met (CMET) - Genetic Screening - ob urine culture - Cytology - PAP( [place]) - Cervicovaginal ancillary only( [place]) - HgB A1c  2. Hx of preeclampsia, prior pregnancy, currently pregnant  - Protein / creatinine ratio, urine - Comp Met (CMET)  3. Chronic hypertension in pregnancy - Labetalol 262m BID    Initial labs drawn. Continue prenatal vitamins. Genetic Screening discussed, Natera  and NIPS: ordered. Ultrasound discussed; fetal anatomic survey: requested. Problem list reviewed and updated. The nature [facility] [place] [facility] and other [facility] Practice Providers was explained to patient; also emphasized that residents, students are part of our team. Routine obstetric precautions reviewed. 50% of 45 min visit spent in counseling and  coordination of care. Return in about 4 weeks (around 07/05/2019).  HMarcille BuffyDNP, CNM  06/07/19  8:56 AM

## 2019-06-07 NOTE — Addendum Note (Signed)
Addended by: Derl Barrow on: 06/07/2019 09:38 AM   Modules accepted: Orders

## 2019-06-08 LAB — PROTEIN / CREATININE RATIO, URINE
Creatinine, Urine: 289 mg/dL
Protein, Ur: 16.7 mg/dL
Protein/Creat Ratio: 58 mg/g creat (ref 0–200)

## 2019-06-08 LAB — COMPREHENSIVE METABOLIC PANEL
ALT: 10 IU/L (ref 0–32)
AST: 15 IU/L (ref 0–40)
Albumin/Globulin Ratio: 1.5 (ref 1.2–2.2)
Albumin: 4 g/dL (ref 3.9–5.0)
Alkaline Phosphatase: 50 IU/L (ref 39–117)
BUN/Creatinine Ratio: 10 (ref 9–23)
BUN: 6 mg/dL (ref 6–20)
Bilirubin Total: 0.3 mg/dL (ref 0.0–1.2)
CO2: 20 mmol/L (ref 20–29)
Calcium: 9.4 mg/dL (ref 8.7–10.2)
Chloride: 103 mmol/L (ref 96–106)
Creatinine, Ser: 0.61 mg/dL (ref 0.57–1.00)
GFR calc Af Amer: 147 mL/min/{1.73_m2} (ref >59)
GFR calc non Af Amer: 127 mL/min/{1.73_m2} (ref >59)
Globulin, Total: 2.7 g/dL (ref 1.5–4.5)
Glucose: 83 mg/dL (ref 65–99)
Potassium: 3.7 mmol/L (ref 3.5–5.2)
Sodium: 137 mmol/L (ref 134–144)
Total Protein: 6.7 g/dL (ref 6.0–8.5)

## 2019-06-10 ENCOUNTER — Encounter: Payer: Self-pay | Admitting: General Practice

## 2019-06-10 LAB — OBSTETRIC PANEL, INCLUDING HIV
Antibody Screen: NEGATIVE
Basophils Absolute: 0 10*3/uL (ref 0.0–0.2)
Basos: 0 %
EOS (ABSOLUTE): 0.1 10*3/uL (ref 0.0–0.4)
Eos: 1 %
HIV Screen 4th Generation wRfx: NONREACTIVE
Hematocrit: 38.7 % (ref 34.0–46.6)
Hemoglobin: 13.7 g/dL (ref 11.1–15.9)
Hepatitis B Surface Ag: NEGATIVE
Immature Grans (Abs): 0 10*3/uL (ref 0.0–0.1)
Immature Granulocytes: 0 %
Lymphocytes Absolute: 1.8 10*3/uL (ref 0.7–3.1)
Lymphs: 16 %
MCH: 30.2 pg (ref 26.6–33.0)
MCHC: 35.4 g/dL (ref 31.5–35.7)
MCV: 85 fL (ref 79–97)
Monocytes Absolute: 0.5 10*3/uL (ref 0.1–0.9)
Monocytes: 5 %
Neutrophils Absolute: 9.2 10*3/uL — ABNORMAL HIGH (ref 1.4–7.0)
Neutrophils: 78 %
Platelets: 311 10*3/uL (ref 150–450)
RBC: 4.54 x10E6/uL (ref 3.77–5.28)
RDW: 12.4 % (ref 11.7–15.4)
RPR Ser Ql: NONREACTIVE
Rh Factor: POSITIVE
Rubella Antibodies, IGG: <0.9 index — ABNORMAL LOW (ref >0.99)
WBC: 11.7 10*3/uL — ABNORMAL HIGH (ref 3.4–10.8)

## 2019-06-10 LAB — CULTURE, OB URINE

## 2019-06-10 LAB — CYTOLOGY - PAP: Diagnosis: NEGATIVE

## 2019-06-10 LAB — URINE CULTURE, OB REFLEX: Organism ID, Bacteria: NO GROWTH

## 2019-06-10 LAB — HEMOGLOBIN A1C
Est. average glucose Bld gHb Est-mCnc: 105 mg/dL
Hgb A1c MFr Bld: 5.3 % (ref 4.8–5.6)

## 2019-06-11 LAB — CERVICOVAGINAL ANCILLARY ONLY
Bacterial Vaginitis (gardnerella): NEGATIVE
Candida Glabrata: NEGATIVE
Candida Vaginitis: NEGATIVE
Chlamydia: NEGATIVE
Comment: NEGATIVE
Comment: NEGATIVE
Comment: NEGATIVE
Comment: NEGATIVE
Comment: NEGATIVE
Comment: NORMAL
Neisseria Gonorrhea: NEGATIVE
Trichomonas: NEGATIVE

## 2019-06-17 ENCOUNTER — Encounter: Payer: Self-pay | Admitting: General Practice

## 2019-07-08 ENCOUNTER — Encounter: Payer: Managed Care, Other (non HMO) | Admitting: Obstetrics and Gynecology

## 2019-08-12 ENCOUNTER — Other Ambulatory Visit: Payer: Self-pay

## 2019-08-12 ENCOUNTER — Ambulatory Visit (INDEPENDENT_AMBULATORY_CARE_PROVIDER_SITE_OTHER): Payer: Managed Care, Other (non HMO) | Admitting: Obstetrics and Gynecology

## 2019-08-12 ENCOUNTER — Encounter: Payer: Self-pay | Admitting: Obstetrics and Gynecology

## 2019-08-12 VITALS — BP 140/90 | HR 72 | Wt 215.0 lb

## 2019-08-12 DIAGNOSIS — O10912 Unspecified pre-existing hypertension complicating pregnancy, second trimester: Secondary | ICD-10-CM

## 2019-08-12 DIAGNOSIS — Z98891 History of uterine scar from previous surgery: Secondary | ICD-10-CM

## 2019-08-12 DIAGNOSIS — Z3A21 21 weeks gestation of pregnancy: Secondary | ICD-10-CM

## 2019-08-12 DIAGNOSIS — O0992 Supervision of high risk pregnancy, unspecified, second trimester: Secondary | ICD-10-CM

## 2019-08-12 DIAGNOSIS — O10919 Unspecified pre-existing hypertension complicating pregnancy, unspecified trimester: Secondary | ICD-10-CM

## 2019-08-12 DIAGNOSIS — O0990 Supervision of high risk pregnancy, unspecified, unspecified trimester: Secondary | ICD-10-CM

## 2019-08-12 DIAGNOSIS — Z8751 Personal history of pre-term labor: Secondary | ICD-10-CM

## 2019-08-12 MED ORDER — ASPIRIN EC 81 MG PO TBEC: 81.0000 mg | DELAYED_RELEASE_TABLET | Freq: Every day | ORAL | 2 refills | Status: DC

## 2019-08-12 MED ORDER — BLOOD PRESSURE MONITOR DEVI: 0 refills | Status: DC

## 2019-08-12 MED ORDER — LABETALOL HCL 200 MG PO TABS: 200.0000 mg | ORAL_TABLET | Freq: Two times a day (BID) | ORAL | 3 refills | Status: DC

## 2019-08-12 NOTE — Progress Notes (Signed)
Patient reports not taking any medication because she moved.

## 2019-08-12 NOTE — Progress Notes (Signed)
   PRENATAL VISIT NOTE  Subjective:  [name] [name] is a 24 y.o. [phone] at [redacted]w[redacted]d being seen today for ongoing prenatal care.  She is currently monitored for the following issues for this high-risk pregnancy and has Sepsis (HCC); Supervision of high risk pregnancy, antepartum; Chronic hypertension in pregnancy; H/O: C-section; and History of preterm delivery on their problem list.  Patient reports no complaints.   .  .  Movement: Present. Denies leaking of fluid.   The following portions of the patient's history were reviewed and updated as appropriate: allergies, current medications, past family history, past medical history, past social history, past surgical history and problem list.   Objective:   Vitals:   08/12/19 1459  BP: 140/90  Pulse: 72  Weight: 215 lb (97.5 kg)    Fetal Status: Fetal Heart Rate (bpm): 166 Fundal Height: 20 cm Movement: Present     General:  Alert, oriented and cooperative. Patient is in no acute distress.  Skin: Skin is warm and dry. No rash noted.   Cardiovascular: Normal heart rate noted  Respiratory: Normal respiratory effort, no problems with respiration noted  Abdomen: Soft, gravid, appropriate for gestational age.  Pain/Pressure: Absent     Pelvic: Cervical exam deferred        Extremities: Normal range of motion.  Edema: None  Mental Status: Normal mood and affect. Normal behavior. Normal judgment and thought content.   Assessment and Plan:  Pregnancy: [id number] at [redacted]w[redacted]d 1. Supervision of high risk pregnancy, antepartum Patient is doing well without complaints Anatomy ultrasound ordered  2. Chronic hypertension in pregnancy Rx labetalol and ASA provided  3. H/O: C-section Will be scheduled for a repeat at 39 weeks  4. History of preterm delivery Iatrogenic due to severe preeclmapsia  Preterm labor symptoms and general obstetric precautions including but not limited to vaginal bleeding, contractions, leaking of fluid and fetal movement  were reviewed in detail with the patient. Please refer to After Visit Summary for other counseling recommendations.   Return in about 4 weeks (around 09/09/2019) for Virtual, ROB, High risk.  No future appointments.  Catalina Antigua, MD

## 2019-08-19 ENCOUNTER — Encounter (HOSPITAL_COMMUNITY): Payer: Self-pay

## 2019-08-19 ENCOUNTER — Other Ambulatory Visit (HOSPITAL_COMMUNITY): Payer: Self-pay | Admitting: *Deleted

## 2019-08-19 ENCOUNTER — Other Ambulatory Visit: Payer: Self-pay

## 2019-08-19 ENCOUNTER — Ambulatory Visit (HOSPITAL_COMMUNITY): Payer: Managed Care, Other (non HMO) | Admitting: *Deleted

## 2019-08-19 ENCOUNTER — Ambulatory Visit (HOSPITAL_COMMUNITY)
Admission: RE | Admit: 2019-08-19 | Discharge: 2019-08-19 | Disposition: A | Discharge status: Home / Self Care | Payer: Managed Care, Other (non HMO) | Source: Ambulatory Visit | Attending: Obstetrics and Gynecology | Admitting: Obstetrics and Gynecology

## 2019-08-19 DIAGNOSIS — O09212 Supervision of pregnancy with history of pre-term labor, second trimester: Secondary | ICD-10-CM | POA: Diagnosis not present

## 2019-08-19 DIAGNOSIS — O0990 Supervision of high risk pregnancy, unspecified, unspecified trimester: Secondary | ICD-10-CM | POA: Insufficient documentation

## 2019-08-19 DIAGNOSIS — O10919 Unspecified pre-existing hypertension complicating pregnancy, unspecified trimester: Secondary | ICD-10-CM | POA: Diagnosis present

## 2019-08-19 DIAGNOSIS — O10012 Pre-existing essential hypertension complicating pregnancy, second trimester: Secondary | ICD-10-CM

## 2019-08-19 DIAGNOSIS — Z98891 History of uterine scar from previous surgery: Secondary | ICD-10-CM | POA: Diagnosis present

## 2019-08-19 DIAGNOSIS — Z3A22 22 weeks gestation of pregnancy: Secondary | ICD-10-CM

## 2019-08-19 DIAGNOSIS — O34219 Maternal care for unspecified type scar from previous cesarean delivery: Secondary | ICD-10-CM | POA: Diagnosis not present

## 2019-08-19 DIAGNOSIS — Z8751 Personal history of pre-term labor: Secondary | ICD-10-CM

## 2019-08-19 DIAGNOSIS — Z362 Encounter for other antenatal screening follow-up: Secondary | ICD-10-CM

## 2019-08-19 DIAGNOSIS — O09292 Supervision of pregnancy with other poor reproductive or obstetric history, second trimester: Secondary | ICD-10-CM | POA: Diagnosis not present

## 2019-08-30 ENCOUNTER — Ambulatory Visit: Payer: Self-pay

## 2019-08-30 NOTE — Telephone Encounter (Signed)
Pt. Is [redacted] weeks pregnant and reports she started having pain in the middle of the night on her right side of her neck down to her elbow and her right lower leg. Moving without difficulty.Does not remember hurting her neck. Instructed to call her PCP for an appointment for today. Verbalizes understanding.  Reason for Disposition . Weakness (i.e., loss of strength) in hand or fingers     (Exception: not truly weak; hand feels weak because of pain)  Answer Assessment - Initial Assessment Questions 1. ONSET: "When did the pain start?"     In the middle of night 2. LOCATION: "Where is the pain located?"     Right arm and lower leg 3. PAIN: "How bad is the pain?" (Scale 1-10; or mild, moderate, severe)   - MILD (1-3): doesn't interfere with normal activities   - MODERATE (4-7): interferes with normal activities (e.g., work or school) or awakens from sleep   - SEVERE (8-10): excruciating pain, unable to do any normal activities, unable to hold a cup of water     6 4. WORK OR EXERCISE: "Has there been any recent work or exercise that involved this part of the body?"     No 5. CAUSE: "What do you think is causing the arm pain?"     Unsure 6. OTHER SYMPTOMS: "Do you have any other symptoms?" (e.g., neck pain, swelling, rash, fever, numbness, weakness)     Neck pain 7. PREGNANCY: "Is there any chance you are pregnant?" "When was your last menstrual period?"     Yes - 23 weeks  Protocols used: ARM PAIN-A-AH

## 2019-09-09 ENCOUNTER — Encounter: Payer: Self-pay | Admitting: Obstetrics and Gynecology

## 2019-09-09 ENCOUNTER — Telehealth (INDEPENDENT_AMBULATORY_CARE_PROVIDER_SITE_OTHER): Payer: Managed Care, Other (non HMO) | Admitting: Obstetrics and Gynecology

## 2019-09-09 VITALS — BP 168/117 | HR 75

## 2019-09-09 DIAGNOSIS — Z8751 Personal history of pre-term labor: Secondary | ICD-10-CM

## 2019-09-09 DIAGNOSIS — O0992 Supervision of high risk pregnancy, unspecified, second trimester: Secondary | ICD-10-CM

## 2019-09-09 DIAGNOSIS — Z3A25 25 weeks gestation of pregnancy: Secondary | ICD-10-CM

## 2019-09-09 DIAGNOSIS — Z98891 History of uterine scar from previous surgery: Secondary | ICD-10-CM

## 2019-09-09 DIAGNOSIS — O10912 Unspecified pre-existing hypertension complicating pregnancy, second trimester: Secondary | ICD-10-CM

## 2019-09-09 DIAGNOSIS — O09212 Supervision of pregnancy with history of pre-term labor, second trimester: Secondary | ICD-10-CM

## 2019-09-09 DIAGNOSIS — O10919 Unspecified pre-existing hypertension complicating pregnancy, unspecified trimester: Secondary | ICD-10-CM

## 2019-09-09 DIAGNOSIS — O0990 Supervision of high risk pregnancy, unspecified, unspecified trimester: Secondary | ICD-10-CM

## 2019-09-09 MED ORDER — LABETALOL HCL 200 MG PO TABS: 400.0000 mg | ORAL_TABLET | Freq: Two times a day (BID) | ORAL | 3 refills | Status: DC

## 2019-09-09 NOTE — Progress Notes (Signed)
TELEHEALTH OBSTETRICS PRENATAL VIRTUAL VIDEO VISIT ENCOUNTER NOTE  Provider location: [facility] [place]   I connected with [name] [name] on 09/09/19 at  8:15 AM EST by MyChart Video Encounter at home and verified that I am speaking with the correct person using two identifiers.   I discussed the limitations, risks, security and privacy concerns of performing an evaluation and management service virtually and the availability of in person appointments. I also discussed with the patient that there may be a patient responsible charge related to this service. The patient expressed understanding and agreed to proceed. Subjective:  [name] [name] is a 24 y.o. [phone] at [redacted]w[redacted]d being seen today for ongoing prenatal care.  She is currently monitored for the following issues for this high-risk pregnancy and has Sepsis (HCC); Supervision of high risk pregnancy, antepartum; Chronic hypertension in pregnancy; H/O: C-section; and History of preterm delivery on their problem list.  Patient reports no complaints.  Contractions: Not present. Vag. Bleeding: None.  Movement: Present. Denies any leaking of fluid.   The following portions of the patient's history were reviewed and updated as appropriate: allergies, current medications, past family history, past medical history, past social history, past surgical history and problem list.   Objective:   Vitals:   09/09/19 0833  BP: (!) 168/117  Pulse: 75    Fetal Status:     Movement: Present     General:  Alert, oriented and cooperative. Patient is in no acute distress.  Respiratory: Normal respiratory effort, no problems with respiration noted  Mental Status: Normal mood and affect. Normal behavior. Normal judgment and thought content.  Rest of physical exam deferred due to type of encounter  Imaging: [place] MFM OB DETAIL +14 WK  Result Date: 08/19/2019 ----------------------------------------------------------------------   OBSTETRICS REPORT                       (Signed Final 08/19/2019 03:16 pm) ---------------------------------------------------------------------- Patient Info  ID #:       [id number]                          D.O.B.:  [birth year] (24 yrs)  Name:       [name] [name]                 Visit Date: 08/19/2019 02:00 pm ---------------------------------------------------------------------- Performed By  Performed By:     Ellin Saba        Ref. Address:     [address]                                                             [address] [id number]  [place]                                                             [id number]  Attending:        Ma Rings MD         [facility]  Referred By:      [facility] ---------------------------------------------------------------------- Orders   #  Description                          Code         Ordered By   1  [place] MFM OB DETAIL +14 WK              76811.01     Lisaann Atha  ----------------------------------------------------------------------   #  Order #                    Accession #                 Episode #   1  [id number]                  [id number]                  [id number]  ---------------------------------------------------------------------- Indications   Hypertension - Chronic/Pre-existing            O10.019   Poor obstetric history: Previous               O09.299   preeclampsia / eclampsia/gestational HTN   SEVERE   [redacted] weeks gestation of pregnancy                Z3A.22   History of cesarean delivery, currently        O34.219   pregnant   Poor obstetric history: Previous preterm       O09.219   delivery, antepartum x 2 32 and 35 weeks   for PRE-E   Encounter for antenatal screening for          Z36.3   malformations   Uterine  fibroids                               O34.10   Low risk NIPS  ---------------------------------------------------------------------- Fetal Evaluation  Num Of Fetuses:         1  Fetal Heart Rate(bpm):  161  Cardiac Activity:       Observed  Presentation:           Cephalic  Placenta:               Anterior  P. Cord Insertion:      Not well visualized  Amniotic Fluid  AFI FV:      Within normal limits  Largest Pocket(cm)                              4 ---------------------------------------------------------------------- Biometry  BPD:      55.8  mm     G. Age:  23w 0d         83  %    CI:        71.58   %    70 - 86                                                          FL/HC:      18.7   %    18.4 - 20.2  HC:       210   mm     G. Age:  23w 1d         81  %    HC/AC:      1.11        1.06 - 1.25  AC:      189.6  mm     G. Age:  23w 5d         89  %    FL/BPD:     70.3   %    71 - 87  FL:       39.2  mm     G. Age:  22w 4d         61  %    FL/AC:      20.7   %    20 - 24  HUM:      40.8  mm     G. Age:  24w 5d       > 95  %  Est. FW:     572  gm      1 lb 4 oz     94  % ---------------------------------------------------------------------- OB History  Gravidity:    3         Prem:   2  Living:       2 ---------------------------------------------------------------------- Gestational Age  LMP:           22w 0d        Date:  03/18/19                 EDD:   12/23/19  U/S Today:     23w 1d                                        EDD:   12/15/19  Best:          22w 0d     Det. By:  LMP  (03/18/19)          EDD:   12/23/19 ---------------------------------------------------------------------- Anatomy  Cranium:               Appears normal         LVOT:                   Appears normal  Cavum:                 Appears normal  Aortic Arch:            Appears normal  Ventricles:            Appears normal         Ductal Arch:            Appears normal  Choroid Plexus:        Appears normal          Diaphragm:              Appears normal  Cerebellum:            Appears normal         Stomach:                Appears normal, left                                                                        sided  Posterior Fossa:       Appears normal         Abdomen:                Appears normal  Nuchal Fold:           Not applicable (>20    Abdominal Wall:         Appears nml (cord                         wks GA)                                        insert, abd wall)  Face:                  Appears normal         Cord Vessels:           Appears normal (3                         (orbits and profile)                           vessel cord)  Lips:                  Appears normal         Kidneys:                Appear normal  Palate:                Appears normal         Bladder:                Appears normal  Thoracic:              Appears normal         Spine:                  Not well visualized  Heart:                 Appears normal  Upper Extremities:      Appears normal                         (4CH, axis, and                         situs)  RVOT:                  Appears normal         Lower Extremities:      Appears normal  Other:  Technically difficult due to fetal position. Nasal bone visualized. Heels          and 5th digit visualized. ---------------------------------------------------------------------- Cervix Uterus Adnexa  Cervix  Length:           3.51  cm.  Normal appearance by transabdominal scan.  Uterus  Multiple fibroids noted, see table below.  Left Ovary  Within normal limits.  Right Ovary  Within normal limits. ---------------------------------------------------------------------- Myomas   Site                     L(cm)      W(cm)      D(cm)      Location   Anterior                 2.2        2          2.2        Subserosal   Anterior                 1.9        1.9        1.8        Subserosal  ----------------------------------------------------------------------   Blood Flow                  RI        PI       Comments  ---------------------------------------------------------------------- Comments  This patient was seen for a detailed fetal anatomy scan due  to a history of chronic hypertension currently currently treated  with labetalol along with a fibroid uterus.  She denies any other significant past medical history and  denies any problems in her current pregnancy.  She had a cell free DNA test earlier in her pregnancy which  indicated a low risk for trisomy 80, 30, and 13. A female fetus  is predicted.  She was informed that the fetal growth and amniotic fluid  level were appropriate for her gestational age.  There were no obvious fetal anomalies noted on today's  ultrasound exam.  The patient was informed that anomalies may be missed due  to technical limitations. If the fetus is in a suboptimal position  or maternal habitus is increased, visualization of the fetus in  the maternal uterus may be impaired.  The implications and management of chronic hypertension in  pregnancy was discussed. The patient was advised that  should her blood pressures continue to be elevated, the  dosage of her antihypertensive medications may need to be  increased.  The increased risk of superimposed  preeclampsia, an indicated preterm delivery, and possible  fetal growth restriction due to chronic hypertension in  pregnancy was discussed. The patient was advised that we  will continue to follow her closely throughout her pregnancy.  We will continue to follow her with monthly growth scans.  Weekly fetal testing  should be started at around 32 weeks.  To decrease her risk of superimposed preeclampsia, she  should start taking a daily baby aspirin (81 mg daily) for  preeclampsia prophylaxis.  A follow-up growth scan was scheduled in 4 weeks. ----------------------------------------------------------------------                   Johnell Comings, MD Electronically Signed Final Report   08/19/2019 03:16 pm  ----------------------------------------------------------------------   Assessment and Plan:  Pregnancy: [id number] at [redacted]w[redacted]d 1. Supervision of high risk pregnancy, antepartum Patient is doing well without complaints Third trimester labs next visit  2. Chronic hypertension in pregnancy Patient did not take morning labetalol and reports that afternoon BP is similar prior to her evening dose Will increase labetalol to 400 BID Continue ASA Follow up growth ultrasound 2/15  3. H/O: C-section Will be scheduled for repeat c-section    Preterm labor symptoms and general obstetric precautions including but not limited to vaginal bleeding, contractions, leaking of fluid and fetal movement were reviewed in detail with the patient. I discussed the assessment and treatment plan with the patient. The patient was provided an opportunity to ask questions and all were answered. The patient agreed with the plan and demonstrated an understanding of the instructions. The patient was advised to call back or seek an in-person office evaluation/go to [facility]U at [facility] for any urgent or concerning symptoms. Please refer to After Visit Summary for other counseling recommendations.   I provided 11 minutes of face-to-face time during this encounter.  No follow-ups on file.  Future Appointments  Date Time Provider [place]  09/16/2019  2:15 PM [place] NURSE [place] [facility]  09/16/2019  2:15 PM [facility] [place] 4 WH-[facility]    Obediah Welles, MD [facility], [place]

## 2019-09-09 NOTE — Progress Notes (Signed)
S/w pt for virtual visit, pt reports fetal movement with occasional pain on left side. Pt states she has not taken BP med today, denies headaches, dizziness and blurred vision.

## 2019-09-16 ENCOUNTER — Encounter (HOSPITAL_COMMUNITY): Payer: Self-pay

## 2019-09-16 ENCOUNTER — Ambulatory Visit (HOSPITAL_COMMUNITY): Payer: Managed Care, Other (non HMO) | Attending: Obstetrics

## 2019-09-16 ENCOUNTER — Ambulatory Visit (HOSPITAL_COMMUNITY): Admission: RE | Admit: 2019-09-16 | Payer: Managed Care, Other (non HMO) | Source: Ambulatory Visit

## 2019-10-08 ENCOUNTER — Other Ambulatory Visit: Payer: Managed Care, Other (non HMO)

## 2019-10-08 ENCOUNTER — Ambulatory Visit (INDEPENDENT_AMBULATORY_CARE_PROVIDER_SITE_OTHER): Payer: Managed Care, Other (non HMO) | Admitting: Obstetrics & Gynecology

## 2019-10-08 ENCOUNTER — Other Ambulatory Visit: Payer: Self-pay

## 2019-10-08 VITALS — BP 180/128 | HR 57 | Wt 217.0 lb

## 2019-10-08 DIAGNOSIS — Z8751 Personal history of pre-term labor: Secondary | ICD-10-CM

## 2019-10-08 DIAGNOSIS — O10919 Unspecified pre-existing hypertension complicating pregnancy, unspecified trimester: Secondary | ICD-10-CM

## 2019-10-08 DIAGNOSIS — O09893 Supervision of other high risk pregnancies, third trimester: Secondary | ICD-10-CM

## 2019-10-08 DIAGNOSIS — O10913 Unspecified pre-existing hypertension complicating pregnancy, third trimester: Secondary | ICD-10-CM

## 2019-10-08 DIAGNOSIS — O10413 Pre-existing secondary hypertension complicating pregnancy, third trimester: Secondary | ICD-10-CM | POA: Diagnosis not present

## 2019-10-08 DIAGNOSIS — Z3A29 29 weeks gestation of pregnancy: Secondary | ICD-10-CM

## 2019-10-08 DIAGNOSIS — Z98891 History of uterine scar from previous surgery: Secondary | ICD-10-CM

## 2019-10-08 DIAGNOSIS — O0990 Supervision of high risk pregnancy, unspecified, unspecified trimester: Secondary | ICD-10-CM

## 2019-10-08 DIAGNOSIS — O0993 Supervision of high risk pregnancy, unspecified, third trimester: Secondary | ICD-10-CM

## 2019-10-08 NOTE — Progress Notes (Signed)
Pt is here for ROB a 2 hour GTT, [redacted]w[redacted]d.

## 2019-10-08 NOTE — Patient Instructions (Signed)
Preeclampsia and Eclampsia Preeclampsia is a serious condition that may develop during pregnancy. This condition causes high blood pressure and increased protein in your urine along with other symptoms, such as headaches and vision changes. These symptoms may develop as the condition gets worse. Preeclampsia may occur at 20 weeks of pregnancy or later. Diagnosing and treating preeclampsia early is very important. If not treated early, it can cause serious problems for you and your baby. One problem it can lead to is eclampsia. Eclampsia is a condition that causes muscle jerking or shaking (convulsions or seizures) and other serious problems for the mother. During pregnancy, delivering your baby may be the best treatment for preeclampsia or eclampsia. For most women, preeclampsia and eclampsia symptoms go away after giving birth. In rare cases, a woman may develop preeclampsia after giving birth (postpartum preeclampsia). This usually occurs within 48 hours after childbirth but may occur up to 6 weeks after giving birth. What are the causes? The cause of preeclampsia is not known. What increases the risk? The following risk factors make you more likely to develop preeclampsia:  Being pregnant for the first time.  Having had preeclampsia during a past pregnancy.  Having a family history of preeclampsia.  Having high blood pressure.  Being pregnant with more than one baby.  Being 35 or older.  Being African-American.  Having kidney disease or diabetes.  Having medical conditions such as lupus or blood diseases.  Being very overweight (obese). What are the signs or symptoms? The most common symptoms are:  Severe headaches.  Vision problems, such as blurred or double vision.  Abdominal pain, especially upper abdominal pain. Other symptoms that may develop as the condition gets worse include:  Sudden weight gain.  Sudden swelling of the hands, face, legs, and feet.  Severe nausea  and vomiting.  Numbness in the face, arms, legs, and feet.  Dizziness.  Urinating less than usual.  Slurred speech.  Convulsions or seizures. How is this diagnosed? There are no screening tests for preeclampsia. Your health care provider will ask you about symptoms and check for signs of preeclampsia during your prenatal visits. You may also have tests that include:  Checking your blood pressure.  Urine tests to check for protein. Your health care provider will check for this at every prenatal visit.  Blood tests.  Monitoring your baby's heart rate.  Ultrasound. How is this treated? You and your health care provider will determine the treatment approach that is best for you. Treatment may include:  Having more frequent prenatal exams to check for signs of preeclampsia, if you have an increased risk for preeclampsia.  Medicine to lower your blood pressure.  Staying in the hospital, if your condition is severe. There, treatment will focus on controlling your blood pressure and the amount of fluids in your body (fluid retention).  Taking medicine (magnesium sulfate) to prevent seizures. This may be given as an injection or through an IV.  Taking a low-dose aspirin during your pregnancy.  Delivering your baby early. You may have your labor started with medicine (induced), or you may have a cesarean delivery. Follow these instructions at home: Eating and drinking   Drink enough fluid to keep your urine pale yellow.  Avoid caffeine. Lifestyle  Do not use any products that contain nicotine or tobacco, such as cigarettes and e-cigarettes. If you need help quitting, ask your health care provider.  Do not use alcohol or drugs.  Avoid stress as much as possible. Rest and get   plenty of sleep. General instructions  Take over-the-counter and prescription medicines only as told by your health care provider.  When lying down, lie on your left side. This keeps pressure off your  major blood vessels.  When sitting or lying down, raise (elevate) your feet. Try putting some pillows underneath your lower legs.  Exercise regularly. Ask your health care provider what kinds of exercise are best for you.  Keep all follow-up and prenatal visits as told by your health care provider. This is important. How is this prevented? There is no known way of preventing preeclampsia or eclampsia from developing. However, to lower your risk of complications and detect problems early:  Get regular prenatal care. Your health care provider may be able to diagnose and treat the condition early.  Maintain a healthy weight. Ask your health care provider for help managing weight gain during pregnancy.  Work with your health care provider to manage any long-term (chronic) health conditions you have, such as diabetes or kidney problems.  You may have tests of your blood pressure and kidney function after giving birth.  Your health care provider may have you take low-dose aspirin during your next pregnancy. Contact a health care provider if:  You have symptoms that your health care provider told you may require more treatment or monitoring, such as: ? Headaches. ? Nausea or vomiting. ? Abdominal pain. ? Dizziness. ? Light-headedness. Get help right away if:  You have severe: ? Abdominal pain. ? Headaches that do not get better. ? Dizziness. ? Vision problems. ? Confusion. ? Nausea or vomiting.  You have any of the following: ? A seizure. ? Sudden, rapid weight gain. ? Sudden swelling in your hands, ankles, or face. ? Trouble moving any part of your body. ? Numbness in any part of your body. ? Trouble speaking. ? Abnormal bleeding.  You faint. Summary  Preeclampsia is a serious condition that may develop during pregnancy.  This condition causes high blood pressure and increased protein in your urine along with other symptoms, such as headaches and vision  changes.  Diagnosing and treating preeclampsia early is very important. If not treated early, it can cause serious problems for you and your baby.  Get help right away if you have symptoms that your health care provider told you to watch for. This information is not intended to replace advice given to you by your health care provider. Make sure you discuss any questions you have with your health care provider. Document Revised: 03/20/2018 Document Reviewed: 02/22/2016 Elsevier Patient Education  2020 [employer] Inc[employer]  

## 2019-10-08 NOTE — Progress Notes (Signed)
   PRENATAL VISIT NOTE  Subjective:  [name] [name] is a 24 y.o. [phone] at [redacted]w[redacted]d being seen today for ongoing prenatal care.  She is currently monitored for the following issues for this high-risk pregnancy and has Sepsis (HCC); Supervision of high risk pregnancy, antepartum; Chronic hypertension in pregnancy; H/O: C-section; and History of preterm delivery on their problem list.  Patient reports no complaints.  Contractions: Irritability. Vag. Bleeding: None.  Movement: Present. Denies leaking of fluid.   The following portions of the patient's history were reviewed and updated as appropriate: allergies, current medications, past family history, past medical history, past social history, past surgical history and problem list.   Objective:   Vitals:   10/08/19 0831 10/08/19 0846  BP: (!) 178/123 (!) 180/128  Pulse: (!) 57   Weight: 217 lb (98.4 kg)     Fetal Status: Fetal Heart Rate (bpm): 135   Movement: Present     General:  Alert, oriented and cooperative. Patient is in no acute distress.  Skin: Skin is warm and dry. No rash noted.   Cardiovascular: Normal heart rate noted  Respiratory: Normal respiratory effort, no problems with respiration noted  Abdomen: Soft, gravid, appropriate for gestational age.  Pain/Pressure: Present     Pelvic: Cervical exam deferred        Extremities: Normal range of motion.  Edema: None  Mental Status: Normal mood and affect. Normal behavior. Normal judgment and thought content.   Assessment and Plan:  Pregnancy: [id number] at [redacted]w[redacted]d 1. Supervision of high risk pregnancy, antepartum Routine labs - Glucose Tolerance, 2 Hours w/1 Hour - CBC - RPR - HIV Antibody (routine testing w rflx)  2. H/O: C-section Repeat   3. Chronic hypertension in pregnancy To MAU for evaluation of uncontrolled CHTN vs. SI preeclampsia. She took a 200 mg dose of labetalol at 0845  Preterm labor symptoms and general obstetric precautions including but not limited to  vaginal bleeding, contractions, leaking of fluid and fetal movement were reviewed in detail with the patient. Please refer to After Visit Summary for other counseling recommendations.   Return in about 1 week (around 10/15/2019).  No future appointments.  Scheryl Darter, MD

## 2019-10-09 LAB — GLUCOSE TOLERANCE, 2 HOURS W/ 1HR
Glucose, 1 hour: 125 mg/dL (ref 65–179)
Glucose, 2 hour: 87 mg/dL (ref 65–152)
Glucose, Fasting: 78 mg/dL (ref 65–91)

## 2019-10-09 LAB — RPR: RPR Ser Ql: NONREACTIVE

## 2019-10-09 LAB — CBC
Hematocrit: 39.6 % (ref 34.0–46.6)
Hemoglobin: 13.6 g/dL (ref 11.1–15.9)
MCH: 30.6 pg (ref 26.6–33.0)
MCHC: 34.3 g/dL (ref 31.5–35.7)
MCV: 89 fL (ref 79–97)
Platelets: 279 10*3/uL (ref 150–450)
RBC: 4.44 x10E6/uL (ref 3.77–5.28)
RDW: 12.5 % (ref 11.7–15.4)
WBC: 11.6 10*3/uL — ABNORMAL HIGH (ref 3.4–10.8)

## 2019-10-09 LAB — HIV ANTIBODY (ROUTINE TESTING W REFLEX): HIV Screen 4th Generation wRfx: NONREACTIVE

## 2019-10-10 ENCOUNTER — Telehealth: Payer: Self-pay

## 2019-10-10 NOTE — Telephone Encounter (Signed)
Called pt per provider order because she was sent to the hospital at visit on 10-08-19. Pt did not go. Left vm to call office immediately.

## 2019-10-11 ENCOUNTER — Observation Stay
Admission: EM | Admit: 2019-10-11 | Discharge: 2019-10-11 | Payer: Managed Care, Other (non HMO) | Attending: Obstetrics and Gynecology | Admitting: Obstetrics and Gynecology

## 2019-10-11 ENCOUNTER — Other Ambulatory Visit: Payer: Self-pay

## 2019-10-11 ENCOUNTER — Encounter: Payer: Self-pay | Admitting: Obstetrics and Gynecology

## 2019-10-11 DIAGNOSIS — Z3A29 29 weeks gestation of pregnancy: Secondary | ICD-10-CM | POA: Diagnosis not present

## 2019-10-11 DIAGNOSIS — O0990 Supervision of high risk pregnancy, unspecified, unspecified trimester: Secondary | ICD-10-CM

## 2019-10-11 DIAGNOSIS — Z8751 Personal history of pre-term labor: Secondary | ICD-10-CM

## 2019-10-11 DIAGNOSIS — O113 Pre-existing hypertension with pre-eclampsia, third trimester: Secondary | ICD-10-CM | POA: Diagnosis present

## 2019-10-11 DIAGNOSIS — O99213 Obesity complicating pregnancy, third trimester: Secondary | ICD-10-CM | POA: Diagnosis not present

## 2019-10-11 DIAGNOSIS — Z98891 History of uterine scar from previous surgery: Secondary | ICD-10-CM

## 2019-10-11 DIAGNOSIS — O10919 Unspecified pre-existing hypertension complicating pregnancy, unspecified trimester: Secondary | ICD-10-CM | POA: Diagnosis present

## 2019-10-11 DIAGNOSIS — O119 Pre-existing hypertension with pre-eclampsia, unspecified trimester: Secondary | ICD-10-CM | POA: Diagnosis present

## 2019-10-11 LAB — SAMPLE TO BLOOD BANK

## 2019-10-11 LAB — CBC
HCT: 39.5 % (ref 36.0–46.0)
Hemoglobin: 13.4 g/dL (ref 12.0–15.0)
MCH: 30.4 pg (ref 26.0–34.0)
MCHC: 33.9 g/dL (ref 30.0–36.0)
MCV: 89.6 fL (ref 80.0–100.0)
Platelets: 192 10*3/uL (ref 150–400)
RBC: 4.41 MIL/uL (ref 3.87–5.11)
RDW: 13.1 % (ref 11.5–15.5)
WBC: 11.9 10*3/uL — ABNORMAL HIGH (ref 4.0–10.5)
nRBC: 0.2 % (ref 0.0–0.2)

## 2019-10-11 LAB — COMPREHENSIVE METABOLIC PANEL
ALT: 55 U/L — ABNORMAL HIGH (ref 0–44)
AST: 43 U/L — ABNORMAL HIGH (ref 15–41)
Albumin: 3 g/dL — ABNORMAL LOW (ref 3.5–5.0)
Alkaline Phosphatase: 154 U/L — ABNORMAL HIGH (ref 38–126)
Anion gap: 7 (ref 5–15)
BUN: 8 mg/dL (ref 6–20)
CO2: 22 mmol/L (ref 22–32)
Calcium: 8.8 mg/dL — ABNORMAL LOW (ref 8.9–10.3)
Chloride: 107 mmol/L (ref 98–111)
Creatinine, Ser: 0.68 mg/dL (ref 0.44–1.00)
GFR calc Af Amer: >60 mL/min (ref >60)
GFR calc non Af Amer: >60 mL/min (ref >60)
Glucose, Bld: 74 mg/dL (ref 70–99)
Potassium: 4.7 mmol/L (ref 3.5–5.1)
Sodium: 136 mmol/L (ref 135–145)
Total Bilirubin: 0.6 mg/dL (ref 0.3–1.2)
Total Protein: 6.4 g/dL — ABNORMAL LOW (ref 6.5–8.1)

## 2019-10-11 LAB — PROTEIN / CREATININE RATIO, URINE
Creatinine, Urine: 310 mg/dL
Protein Creatinine Ratio: 0.43 mg/mg{Cre} — ABNORMAL HIGH (ref 0.00–0.15)
Total Protein, Urine: 133 mg/dL

## 2019-10-11 MED ORDER — NIFEDIPINE 10 MG PO CAPS
ORAL_CAPSULE | ORAL | Status: AC
  Filled 2019-10-11: qty 1

## 2019-10-11 MED ORDER — ACETAMINOPHEN 500 MG PO TABS
1000.0000 mg | ORAL_TABLET | Freq: Four times a day (QID) | ORAL | Status: DC | PRN
  Administered 2019-10-11: 1000 mg via ORAL
  Filled 2019-10-11: qty 2

## 2019-10-11 MED ORDER — CALCIUM GLUCONATE 10 % IV SOLN
INTRAVENOUS | Status: AC
  Filled 2019-10-11: qty 10

## 2019-10-11 MED ORDER — LABETALOL HCL 5 MG/ML IV SOLN: 40.0000 mg | INTRAVENOUS | Status: DC | PRN

## 2019-10-11 MED ORDER — LACTATED RINGERS IV SOLN: INTRAVENOUS | Status: DC

## 2019-10-11 MED ORDER — NIFEDIPINE 10 MG PO CAPS
10.0000 mg | ORAL_CAPSULE | ORAL | Status: DC | PRN
  Administered 2019-10-11 (×2): 10 mg via ORAL
  Filled 2019-10-11: qty 1

## 2019-10-11 MED ORDER — LACTATED RINGERS IV SOLN: 125.0000 mL | INTRAVENOUS | 0 refills | Status: DC

## 2019-10-11 MED ORDER — NIFEDIPINE 10 MG PO CAPS
20.0000 mg | ORAL_CAPSULE | ORAL | Status: DC | PRN
  Administered 2019-10-11: 20 mg via ORAL
  Filled 2019-10-11: qty 2

## 2019-10-11 MED ORDER — MAGNESIUM SULFATE 40 GM/1000ML IV SOLN: 2.0000 g/h | INTRAVENOUS | Status: DC

## 2019-10-11 MED ORDER — MAGNESIUM SULFATE BOLUS VIA INFUSION
4.0000 g | Freq: Once | INTRAVENOUS | Status: AC
  Administered 2019-10-11: 4 g via INTRAVENOUS
  Filled 2019-10-11: qty 1000

## 2019-10-11 MED ORDER — MAGNESIUM SULFATE 40 GM/1000ML IV SOLN
2.0000 g/h | INTRAVENOUS | Status: DC
  Filled 2019-10-11: qty 1000

## 2019-10-11 MED ORDER — BETAMETHASONE SOD PHOS & ACET 6 (3-3) MG/ML IJ SUSP
12.0000 mg | Freq: Once | INTRAMUSCULAR | Status: AC
  Administered 2019-10-11: 12 mg via INTRAMUSCULAR
  Filled 2019-10-11: qty 5

## 2019-10-11 NOTE — Discharge Summary (Deleted)
Discharge instructions given, pt states understanding, denies any other needs at this time. Pt left in stable condition.

## 2019-10-11 NOTE — Plan of Care (Signed)
Patient awaiting trasfer to [facility] for continued severe blood pressure management and higher level of neonatal care should delivery become imminent.

## 2019-10-11 NOTE — Discharge Summary (Signed)
Physician Discharge Summary  Patient ID: [name] [name] MRN: [id number] DOB/AGE: April 07, 1996 24 y.o.  Admit date: 10/11/2019 Admitting provider: Tresea Mall, CNM/Thu Baggett Jean Rosenthal, MD Discharge date: 10/11/2019   Admission Diagnoses:  1) intrauterine pregnancy at [redacted]w[redacted]d  2) chronic hypertension with superimposed severe preeclampsia 3) history of cesarean delivery 4) history of preterm delivery  Discharge Diagnoses:  Principal Problem:   Chronic hypertension with superimposed pre-eclampsia Active Problems:   Supervision of high risk pregnancy, antepartum   Chronic hypertension in pregnancy   H/O: C-section   History of preterm delivery   Indication for care in labor and delivery, antepartum    History of Present Illness: [name] [name] is a 24 y.o. [id number] female at [redacted]w[redacted]d dated by LMP.  Her pregnancy has been complicated by chronic hypertension, history of severe preeclampsia, history of 2 preterm LTCS, obesity .    She denies contractions.   She denies leakage of fluid.   She denies vaginal bleeding.   She reports fetal movement.    The patient is taking Labetalol 400 mg BID for her CHTN. She was seen in her clinic 3 days ago with uncontrolled hypertension/severe range blood pressure 180s over 120s. She was advised to go to MAU for evaluation which she did not do. This morning she wasn't feeling well and her BP was 170/110. She took her first dose of Labetalol as prescribed, rested and recheck of BP was still elevated. She took her second dose of Labetalol, called her provider and they told her to be seen right away. She came to [facility] for evaluation. She admits occasional headaches and blurry vision. She did not have a headache today until about 1 hour ago which she attributes to not eating since breakfast. She denies epigastric pain.   She was admitted for observation, placed on monitors, labs drawn, 1st dose betamethasone administered, prophylactic IV magnesium started because of  new diagnosis of Chronic hypertension with Super-imposed preeclampsia with severe features. She responded well to PO Procardia for severe range BP. Labs are remarkable for mildly elevated liver enzymes, elevated P/C ratio, normal platelets and normal hemoglobin.     Past Medical History:  Diagnosis Date  . Chlamydia   . Hypertension   . Infection    UTI  . Ovarian cyst   . Pregnancy induced hypertension     Past Surgical History:  Procedure Laterality Date  . CESAREAN SECTION N/A 03/21/2013   Procedure: CESAREAN SECTION;  Surgeon: Willodean Rosenthal, MD;  Location: [facility];  Service: Obstetrics;  Laterality: N/A;  . CESAREAN SECTION N/A 01/06/2016   Procedure: CESAREAN SECTION;  Surgeon: Catalina Antigua, MD;  Location: [facility];  Service: Obstetrics;  Laterality: N/A;    No current facility-administered medications on file prior to encounter.   Current Outpatient Medications on File Prior to Encounter  Medication Sig Dispense Refill  . labetalol (NORMODYNE) 200 MG tablet Take 2 tablets (400 mg total) by mouth 2 (two) times daily. 120 tablet 3  . aspirin EC 81 MG tablet Take 1 tablet (81 mg total) by mouth daily. (Patient not taking: Reported on 08/12/2019) 90 tablet 3  . aspirin EC 81 MG tablet Take 1 tablet (81 mg total) by mouth daily. Take after 12 weeks for prevention of preeclampsia later in pregnancy (Patient not taking: Reported on 08/19/2019) 300 tablet 2  . Blood Pressure Monitor DEVI Please check blood pressure 1-2 per weeks. (Patient not taking: Reported on 09/09/2019) 1 each 0  . diphenhydramine-acetaminophen (TYLENOL PM) 25-500 MG TABS tablet  Take 1 tablet by mouth at bedtime as needed (sleep).     Marland Kitchen ibuprofen (ADVIL,MOTRIN) 600 MG tablet Take 1 tablet (600 mg total) by mouth every 6 (six) hours. (Patient not taking: Reported on 12/29/2016) 30 tablet 0  . labetalol (NORMODYNE) 200 MG tablet Take 1 tablet (200 mg total) by mouth 2 (two) times daily. (Patient not  taking: Reported on 08/12/2019) 60 tablet 3  . labetalol (NORMODYNE) 200 MG tablet Take 1 tablet (200 mg total) by mouth 2 (two) times daily. (Patient not taking: Reported on 10/08/2019) 60 tablet 3  . Prenatal Vit-Fe Fumarate-FA (PRENATAL MULTIVITAMIN) TABS tablet Take 1 tablet by mouth daily at 12 noon.      Allergies  Allergen Reactions  . Shellfish Allergy     Social History   Socioeconomic History  . Marital status: Single    Spouse name: Not on file  . Number of children: 2  . Years of education: current college  . Highest education level: Some college, no degree  Occupational History  . Not on file  Tobacco Use  . Smoking status: Former Smoker    Packs/day: 0.50    Types: Cigarettes  . Smokeless tobacco: Never Used  . Tobacco comment: Stopped when she found out she was pregnant  Substance and Sexual Activity  . Alcohol use: No  . Drug use: No  . Sexual activity: Yes    Birth control/protection: None  Other Topics Concern  . Not on file  Social History Narrative  . Not on file   Social Determinants of Health   Financial Resource Strain: Low Risk   . Difficulty of Paying Living Expenses: Not very hard  Food Insecurity: No Food Insecurity  . Worried About Programme researcher, broadcasting/film/video in the Last Year: Never true  . Ran Out of Food in the Last Year: Never true  Transportation Needs: No Transportation Needs  . Lack of Transportation (Medical): No  . Lack of Transportation (Non-Medical): No  Physical Activity: Sufficiently Active  . Days of Exercise per Week: 3 days  . Minutes of Exercise per Session: 60 min  Stress: No Stress Concern Present  . Feeling of Stress : Not at all  Social Connections: Moderately Isolated  . Frequency of Communication with Friends and Family: More than three times a week  . Frequency of Social Gatherings with Friends and Family: Once a week  . Attends Religious Services: Never  . Active Member of Clubs or Organizations: No  . Attends Tax inspector Meetings: Never  . Marital Status: Never married  Intimate Partner Violence: Not At Risk  . Fear of Current or Ex-Partner: No  . Emotionally Abused: No  . Physically Abused: No  . Sexually Abused: No    Family History  Problem Relation Age of Onset  . Asthma Mother   . Cancer Paternal Grandfather      Review of Systems  Constitutional: Negative.   HENT: Negative.   Eyes: Negative.   Respiratory: Negative.   Cardiovascular: Negative.   Gastrointestinal: Negative.   Genitourinary: Negative.   Musculoskeletal: Negative.   Skin: Negative.   Neurological: Positive for headaches. Negative for dizziness, tingling, tremors, sensory change, speech change, focal weakness, seizures, loss of consciousness and weakness.  Psychiatric/Behavioral: Negative.      Physical Exam: BP (!) 146/81   Pulse 87   Temp 98.5 F (36.9 C) (Oral)   Resp 18   Ht 5\' 6"  (1.676 m)   Wt 98.4 kg  LMP 03/18/2019 (Exact Date)   SpO2 99%   BMI 35.02 kg/m   BP Range 126-167/77-116 Physical Exam Constitutional:      General: She is not in acute distress.    Appearance: Normal appearance. She is well-developed.  HENT:     Head: Normocephalic and atraumatic.  Eyes:     General: No scleral icterus.    Conjunctiva/sclera: Conjunctivae normal.  Cardiovascular:     Rate and Rhythm: Normal rate and regular rhythm.     Heart sounds: No murmur. No friction rub. No gallop.   Pulmonary:     Effort: Pulmonary effort is normal. No respiratory distress.     Breath sounds: Normal breath sounds. No wheezing or rales.  Abdominal:     General: Bowel sounds are normal.     Palpations: Abdomen is soft.     Comments: Gravid/NT  Musculoskeletal:        General: Normal range of motion.     Cervical back: Normal range of motion and neck supple.  Neurological:     General: No focal deficit present.     Mental Status: She is alert and oriented to person, place, and time.     Cranial Nerves: No cranial  nerve deficit.  Skin:    General: Skin is warm and dry.     Findings: No erythema.  Psychiatric:        Mood and Affect: Mood normal.        Behavior: Behavior normal.        Judgment: Judgment normal.     Consults: None  Significant Findings/ Diagnostic Studies:  Lab Results  Component Value Date   WBC 11.9 (H) 10/11/2019   HGB 13.4 10/11/2019   HCT 39.5 10/11/2019   PLT 192 10/11/2019   CREATININE 0.68 10/11/2019   ALT 55 (H) 10/11/2019   AST 43 (H) 10/11/2019   PROTCRRATIO 0.43 (H) 10/11/2019     Procedures:  NST: Baseline FHR: 140 beats/min Variability: moderate Accelerations: present (10x10) Decelerations: absent Tocometry: quiet  Interpretation:  INDICATIONS: chronic hypertension with superimposed severe preeclampsia RESULTS:  A NST procedure was performed with FHR monitoring and a normal baseline established, appropriate time of 20-40 minutes of evaluation, and accels >2 seen w 15x15 characteristics.  Results show a REACTIVE NST.  Administration of betamethasone : given at 3:47 PM 10/11/2019 Magnesium sulfate administration: bolus 4 grams, 2 gram/hour maintenance   Hospital Course: The patient was admitted to Labor and Delivery Triage for observation. She was noted to have persistently elevated severe-range blood pressure values.  She was treated with nifedipine 10 mg x 1 with resultant normalized blood pressures. This lasted a few hours and her blood pressures began to elevate. She was treated again with nifedipine 10 mg, this did not result in improved BPs. So, she was treated with 20 mg x 2 with improved BPs.  She noted a mild, intermittent headache with no visual changes. Her vitals were otherwise normal. The fetal tracing was reassuring overall.  She was started on magnesium and given betamethasone x 1 dose.  Given her new-onset proteinuria and severe-range blood pressures, she was diagnosed with superimposed severe preeclampsia. Given her gestational age, she was  transferred to [facility].   Discharge Condition: stable  Disposition: Discharge disposition: [place] Not Defined       Diet: Regular diet  Discharge Activity: Bedrest   Allergies as of 10/11/2019      Reactions   Shellfish Allergy  Medication List    STOP taking these medications   Blood Pressure Monitor Devi   diphenhydramine-acetaminophen 25-500 MG Tabs tablet Commonly known as: TYLENOL PM   ibuprofen 600 MG tablet Commonly known as: ADVIL     TAKE these medications   aspirin EC 81 MG tablet Take 1 tablet (81 mg total) by mouth daily. What changed: Another medication with the same name was removed. Continue taking this medication, and follow the directions you see here.   labetalol 200 MG tablet Commonly known as: NORMODYNE Take 2 tablets (400 mg total) by mouth 2 (two) times daily. What changed: Another medication with the same name was removed. Continue taking this medication, and follow the directions you see here.   lactated ringers infusion Inject 125 mLs into the vein continuous.   magnesium sulfate 40 grams in SWI 1000 mL 40 GM/1000ML Soln Inject 2 g/hr into the vein continuous.   prenatal multivitamin Tabs tablet Take 1 tablet by mouth daily at 12 noon.        Total time spent taking care of this patient: 75 minutes  Signed: Thomasene Mohair, MD  10/11/2019, 7:14 PM

## 2019-10-11 NOTE — H&P (Addendum)
OB History & Physical   History of Present Illness:  Chief Complaint: high blood pressure  HPI:  [name] [name] is a 24 y.o. [phone] female at [redacted]w[redacted]d dated by LMP.  Her pregnancy has been complicated by chronic hypertension, history of severe preeclampsia, history of 2 preterm LTCS, obesity .    She denies contractions.   She denies leakage of fluid.   She denies vaginal bleeding.   She reports fetal movement.    The patient is taking Labetalol 400 mg BID for her CHTN. She was seen in her clinic 3 days ago with uncontrolled hypertension/severe range blood pressure 180s over 120s. She was advised to go to MAU for evaluation which she did not do. This morning she wasn't feeling well and her BP was 170/110. She took her first dose of Labetalol as prescribed, rested and recheck of BP was still elevated. She took her second dose of Labetalol, called her provider and they told her to be seen right away. She came to [facility] for evaluation. She admits occasional headaches and blurry vision. She did not have a headache today until about 1 hour ago which she attributes to not eating since breakfast. She denies epigastric pain.   She was admitted for observation, placed on monitors, labs drawn, 1st dose betamethasone administered, prophylactic IV magnesium started because of new diagnosis of Chronic hypertension with Super-imposed preeclampsia with severe features. She responded well to PO Procardia for severe range BP. Labs are remarkable for mildly elevated liver enzymes, elevated P/C ratio, normal platelets and normal hemoglobin.    I consulted with Dr Jean Rosenthal who recommended transfer due to diagnosis.  Patient status reviewed with Dr Jean Rosenthal who assumed care of patient at 4:45 PM  Total weight gain for pregnancy: 5.897 kg   Obstetrical Problem List: 05/28/2019 Problems (from 05/28/19 to present)     Problem Noted Resolved   H/O: C-section 08/12/2019 by Catalina Antigua, MD No   Overview Signed 08/12/2019   2:59 PM by Catalina Antigua, MD    Previous c-section x 2       History of preterm delivery 08/12/2019 by Catalina Antigua, MD No   Overview Signed 08/12/2019  3:06 PM by Catalina Antigua, MD    Due to severe preeclampsia      Chronic hypertension in pregnancy 06/07/2019 by Armando Reichert, CNM No   Overview Signed 06/07/2019  8:54 AM by Armando Reichert, CNM    - Baseline labs at [facility] - Start baby ASA - Labetalol 200mg  BID      Supervision of high risk pregnancy, antepartum 05/28/2019 by 05/30/2019, RN No   Overview Addendum 10/08/2019  8:34 AM by 12/08/2019, RN     Nursing Staff Provider  Office Location  [facility] Dating  LMP c/w Fr[place] Chase  Language  English Anatomy US  normal  Flu Vaccine   Genetic Screen  NIPS: low risk  Female  AFP:  TDaP vaccine   declined 10/08/19 Hgb A1C or  GTT Early: 5.3  Third trimester   Rhogam     LAB RESULTS   Feeding Plan Breast Blood Type B/Positive/-- (11/06 04-04-1982)   Contraception Pills? Antibody Negative (11/06 0923)  Circumcision Undecided Rubella <0.90 (11/06 0923) Non-Immune   Pediatrician  Undecided RPR Non Reactive (11/06 0923)   Support Person Dajwoun-FOB HBsAg Negative (11/06 04-04-1982)   Prenatal Classes No HIV Non Reactive (11/06 04-04-1982)  BTL Consent  GBS  (For PCN allergy, check sensitivities)   VBAC Consent  Pap  06/07/2019 NIL     Hgb Electro   normal  BP Cuff Has cuff CF normal    SMA normal    Waterbirth  [ ]  Class [ ]  Consent [ ]  CNM visit             Maternal Medical History:   Past Medical History:  Diagnosis Date   Chlamydia    Hypertension    Infection    UTI   Ovarian cyst    Pregnancy induced hypertension     Past Surgical History:  Procedure Laterality Date   CESAREAN SECTION N/A 03/21/2013   Procedure: CESAREAN SECTION;  Surgeon: , MD;  Location: [facility];  Service: Obstetrics;  Laterality: N/A;   CESAREAN SECTION N/A 01/06/2016   Procedure: CESAREAN SECTION;  Surgeon: 03/23/2013,  MD;  Location: [facility];  Service: Obstetrics;  Laterality: N/A;    Allergies  Allergen Reactions   Shellfish Allergy     Prior to Admission medications   Medication Sig Start Date End Date Taking? Authorizing Provider  labetalol (NORMODYNE) 200 MG tablet Take 2 tablets (400 mg total) by mouth 2 (two) times daily. 09/09/19  Yes Constant, Peggy, MD  aspirin EC 81 MG tablet Take 1 tablet (81 mg total) by mouth daily. Patient not taking: Reported on 08/12/2019 06/07/19   11/07/19, CNM  aspirin EC 81 MG tablet Take 1 tablet (81 mg total) by mouth daily. Take after 12 weeks for prevention of preeclampsia later in pregnancy Patient not taking: Reported on 08/19/2019 08/12/19   Constant, Peggy, MD  Blood Pressure Monitor DEVI Please check blood pressure 1-2 per weeks. Patient not taking: Reported on 09/09/2019 08/12/19   Constant, Peggy, MD  diphenhydramine-acetaminophen (TYLENOL PM) 25-500 MG TABS tablet Take 1 tablet by mouth at bedtime as needed (sleep).     [provider]  ibuprofen (ADVIL,MOTRIN) 600 MG tablet Take 1 tablet (600 mg total) by mouth every 6 (six) hours. Patient not taking: Reported on 12/29/2016 01/08/16   10/10/19, MD  labetalol (NORMODYNE) 200 MG tablet Take 1 tablet (200 mg total) by mouth 2 (two) times daily. Patient not taking: Reported on 08/12/2019 06/07/19   Olena Leatherwood D, CNM  labetalol (NORMODYNE) 200 MG tablet Take 1 tablet (200 mg total) by mouth 2 (two) times daily. Patient not taking: Reported on 10/08/2019 08/12/19   Constant, Peggy, MD  Prenatal Vit-Fe Fumarate-FA (PRENATAL MULTIVITAMIN) TABS tablet Take 1 tablet by mouth daily at 12 noon.    [provider]    OB History  Gravida Para Term Preterm AB Living  3 2 0 2 0 2  SAB TAB Ectopic Multiple Live Births  0 0 0 0 2    # Outcome Date GA Lbr Len/2nd Weight Sex Delivery Anes PTL Lv  3 Current           2 Preterm 01/06/16 [redacted]w[redacted]d  1750 g M CS-LTranv Spinal  LIV  1 Preterm  03/21/13 [redacted]w[redacted]d  1389 g F CS-LTranv Spinal  LIV    [facility]redacted[facility]d  Social History: She  reports that she has quit smoking. Her smoking use included cigarettes. She smoked 0.50 packs per day. She has never used smokeless tobacco. She reports that she does not drink alcohol or use drugs.  Family History: family history includes Asthma in her mother; Cancer in her paternal grandfather.    Review of Systems:  Review of Systems  Constitutional: Negative.  HENT: Negative.   Eyes: Negative.   Respiratory: Negative.   Cardiovascular: Negative.   Gastrointestinal: Negative.   Genitourinary: Negative.   Musculoskeletal: Negative.   Skin: Negative.   Neurological: Positive for headaches.  Psychiatric/Behavioral: Negative.      Physical Exam:  BP (!) 137/94   Pulse 65   Temp 98.5 F (36.9 C) (Oral)   Resp 18   Ht 5\' 6"  (1.676 m)   Wt 98.4 kg   LMP 03/18/2019 (Exact Date)   SpO2 99%   BMI 35.02 kg/m   BP Range: SBPs 160s even after treatment.   Constitutional: Well nourished, well developed female in no acute distress.  HEENT: normal Skin: Warm and dry.  Cardiovascular: Regular rate and rhythm.   Extremity:  trace edema   Respiratory: Clear to auscultation bilateral. Normal respiratory effort Abdomen: FHT present Back: no CVAT Neuro: DTRs 1+, Cranial nerves grossly intact Psych: Alert and Oriented x3. No memory deficits. Normal mood and affect.  MS: normal gait, normal bilateral lower extremity ROM/strength/stability.  Pelvic exam: deferred  Toco: negative for contractions Fetal well being: 135 bpm, moderate variability, +accelerations, -decelerations  Results for ANGELEEN, HORNEY (MRN [name]) as of 10/11/2019 16:32  Ref. Range 10/11/2019 13:49 10/11/2019 14:01 10/11/2019 15:36  COMPREHENSIVE METABOLIC PANEL Unknown  Rpt (A)   Sodium Latest Ref Range: 135 - 145 mmol/L  136   Potassium Latest Ref Range: 3.5 - 5.1 mmol/L   4.7   Chloride Latest Ref Range: 98 - 111 mmol/L  107   CO2 Latest Ref Range: 22 - 32 mmol/L  22   Glucose Latest Ref Range: 70 - 99 mg/dL  74   BUN Latest Ref Range: 6 - 20 mg/dL  8   Creatinine Latest Ref Range: 0.44 - 1.00 mg/dL  12/11/2019   Calcium Latest Ref Range: 8.9 - 10.3 mg/dL  8.8 (L)   Anion gap Latest Ref Range: 5 - 15   7   Alkaline Phosphatase Latest Ref Range: 38 - 126 U/L  154 (H)   Albumin Latest Ref Range: 3.5 - 5.0 g/dL  3.0 (L)   AST Latest Ref Range: 15 - 41 U/L  43 (H)   ALT Latest Ref Range: 0 - 44 U/L  55 (H)   Total Protein Latest Ref Range: 6.5 - 8.1 g/dL  6.4 (L)   Total Bilirubin Latest Ref Range: 0.3 - 1.2 mg/dL  0.6   GFR, Est Non African American Latest Ref Range: >60 mL/min  >60   GFR, Est African American Latest Ref Range: >60 mL/min  >60   WBC Latest Ref Range: 4.0 - 10.5 K/uL  11.9 (H)   RBC Latest Ref Range: 3.87 - 5.11 MIL/uL  4.41   Hemoglobin Latest Ref Range: 12.0 - 15.0 g/dL  4.25   HCT Latest Ref Range: 36.0 - 46.0 %  39.5   MCV Latest Ref Range: 80.0 - 100.0 fL  89.6   MCH Latest Ref Range: 26.0 - 34.0 pg  30.4   MCHC Latest Ref Range: 30.0 - 36.0 g/dL  95.6   RDW Latest Ref Range: 11.5 - 15.5 %  13.1   Platelets Latest Ref Range: 150 - 400 K/uL  192   nRBC Latest Ref Range: 0.0 - 0.2 %  0.2   Blood Bank Specimen Unknown   SAMPLE AVAILABLE FOR TESTING  Sample Expiration Unknown   10/14/2019,2359...  Total Protein, Urine Latest Units: mg/dL 10/16/2019    Protein Creatinine Ratio Latest Ref Range: 0.00 -  0.15 mg/mgCre 0.43 (H)    Creatinine, Urine Latest Units: mg/dL 310      Covid test results pending  Assessment:  [name] [name] is a 24 y.o. [phone] female at [redacted]w[redacted]d with Chronic hypertension with super-imposed preeclampsia.   Plan:  Admit to Labor & Delivery for observation CBC, CMP, Protein/Creatinine ratio IV, LR at 125 Severe range BP: PO Procardia protocol Magnesium Sulfate 4G/2G seizure prophylaxis Betamethasone 12 mg IM first  dose Transfer to tertiary care center for NICU access   Rod Can, [facility] 10/11/2019 4:06 PM   I have personally reviewed the patient's history, indication for care, and the plan as formulated by the midwife.  I agree with the above elements or have edited them to reflect my assessment and plan.  Prentice Docker, MD 10/11/2019 6:48 PM

## 2019-10-11 NOTE — OB Triage Note (Signed)
Patient G3P2 presents to labor and delivery for elevated blood pressure. The patient reports preterm delivery with last 2 babies due to preeclampsia. She states her deliveries were both c/s due to "baby's heart rate not tolerating labor" and plans to have a repeat cesarean section. Patient reports taking her labetalol this morning at 0800 and 1030 as she normally does. She states she took her BP after taking these medications and noted her BP to be 170s/110s both times. She states she was told to be seen for these BPs. Patient gets prenatal care at [facility] [place] and plans to deliver at [facility] [place]. BP on arrival was 162/114 and repeat was 150/110. Orders were obtained from [place], CNM for patient to receive 10 mg procardia (see MAR for administration). Patient denies headache today (but states she had one last yesterday), denies blurry vision/seeing spots, RUQ pain, and swelling. RN assessed patient and noted +1 reflexes and absent clonus. Monitors applied and assessing.

## 2019-10-11 NOTE — Progress Notes (Signed)
Patient transferred via [facility] transport to [facility] L&D. Report given to Vonna Kotyk, paramedic. EMTALA, transport report, medical necessity of transport form, and facesheet sent with transport team. Patient esignature was obtained before discharge from our facility

## 2019-10-11 NOTE — Progress Notes (Signed)
Patient transferred to [facility]. All transfer/discharge paperwork given to transport team

## 2019-10-12 MED ORDER — SODIUM CHLORIDE FLUSH 0.9 % IV SOLN
10.00 | INTRAVENOUS | Status: DC
Start: ? — End: 2019-10-12

## 2019-10-12 MED ORDER — MAGNESIUM SULFATE 20 GM/500ML IV SOLN
2.00 | INTRAVENOUS | Status: DC
Start: ? — End: 2019-10-12

## 2019-10-12 MED ORDER — OXYTOCIN-SODIUM CHLORIDE 30-0.9 UT/500ML-% IV SOLN
30.00 | INTRAVENOUS | Status: DC
Start: ? — End: 2019-10-12

## 2019-10-12 MED ORDER — BETAMETHASONE SOD PHOS & ACET 6 (3-3) MG/ML IJ SUSP
12.00 | INTRAMUSCULAR | Status: DC
Start: 2019-10-12 — End: 2019-10-12

## 2019-10-12 MED ORDER — LACTATED RINGERS IV SOLN
INTRAVENOUS | Status: DC
Start: ? — End: 2019-10-12

## 2019-10-12 MED ORDER — SODIUM CHLORIDE FLUSH 0.9 % IV SOLN
10.00 | INTRAVENOUS | Status: DC
Start: 2019-10-14 — End: 2019-10-12

## 2019-10-12 MED ORDER — LACTATED RINGERS IV SOLN
500.00 | INTRAVENOUS | Status: DC
Start: ? — End: 2019-10-12

## 2019-10-12 MED ORDER — LABETALOL HCL 200 MG PO TABS
400.00 | ORAL_TABLET | ORAL | Status: DC
Start: 2019-10-12 — End: 2019-10-12

## 2019-10-12 MED ORDER — PRENATAL 28-0.8 MG PO TABS
1.00 | ORAL_TABLET | ORAL | Status: DC
Start: 2019-10-15 — End: 2019-10-12

## 2019-10-14 MED ORDER — DSS 100 MG PO CAPS
100.00 | ORAL_CAPSULE | ORAL | Status: DC
Start: 2019-10-14 — End: 2019-10-14

## 2019-10-14 MED ORDER — LABETALOL HCL 200 MG PO TABS
400.00 | ORAL_TABLET | ORAL | Status: DC
Start: 2019-10-14 — End: 2019-10-14

## 2019-10-14 MED ORDER — MAGNESIUM HYDROXIDE 400 MG/5ML PO SUSP
30.00 | ORAL | Status: DC
Start: ? — End: 2019-10-14

## 2019-10-14 MED ORDER — SIMETHICONE 80 MG PO CHEW
80.00 | CHEWABLE_TABLET | ORAL | Status: DC
Start: ? — End: 2019-10-14

## 2019-10-15 ENCOUNTER — Encounter: Payer: Managed Care, Other (non HMO) | Admitting: Obstetrics and Gynecology

## 2019-11-04 ENCOUNTER — Ambulatory Visit: Payer: Managed Care, Other (non HMO)

## 2019-11-07 ENCOUNTER — Other Ambulatory Visit: Payer: Self-pay

## 2019-11-07 ENCOUNTER — Ambulatory Visit: Payer: Managed Care, Other (non HMO)

## 2019-11-07 DIAGNOSIS — Z5189 Encounter for other specified aftercare: Secondary | ICD-10-CM

## 2019-11-07 NOTE — Progress Notes (Signed)
  Subjective:     [name] [name] is a 24 y.o. female who presents to the clinic 3 weeks status post C-Section  Eating a regular diet without difficulty. Bowel movements are normal. The patient is not having any pain.  The following portions of the patient's history were reviewed and updated as appropriate: allergies, current medications, past family history, past medical history, past social history, past surgical history and problem list.   Review of Systems   Objective:    LMP 03/18/2019 (Exact Date)  General:  alert  Abdomen: soft, non-tender, no abnormal masses  Incision:   healing well, no drainage, no erythema, no hernia, no seroma, no swelling, no dehiscence, incision well approximated     Assessment:    Doing well postoperatively.   Plan:    1. Continue any current medications. 2. Wound care discussed. 3. Activity restrictions: Discuss at next office visit next week with provider  4. Anticipated return to work: not applicable. 5. Follow up: 1 week for PP.   Kennon Portela, CMA

## 2019-11-13 ENCOUNTER — Ambulatory Visit (INDEPENDENT_AMBULATORY_CARE_PROVIDER_SITE_OTHER): Payer: Managed Care, Other (non HMO) | Admitting: Obstetrics

## 2019-11-13 ENCOUNTER — Encounter: Payer: Self-pay | Admitting: Obstetrics

## 2019-11-13 ENCOUNTER — Other Ambulatory Visit: Payer: Self-pay

## 2019-11-13 DIAGNOSIS — I10 Essential (primary) hypertension: Secondary | ICD-10-CM | POA: Diagnosis not present

## 2019-11-13 DIAGNOSIS — E669 Obesity, unspecified: Secondary | ICD-10-CM

## 2019-11-13 DIAGNOSIS — Z5189 Encounter for other specified aftercare: Secondary | ICD-10-CM | POA: Diagnosis not present

## 2019-11-13 NOTE — Progress Notes (Signed)
    Post Partum Visit Note  [name] [name] is a 24 y.o. [phone] female who presents for a postpartum visit. She is 4 weeks postpartum following a repeat cesarean section.  I have fully reviewed the prenatal and intrapartum course. The delivery was at 30 gestational weeks.  Anesthesia: spinal. Postpartum course has been doing well. Baby is doing well in NICU-in [place]. Baby is feeding by both breast and bottle - breast and Similac Neosure. Bleeding no bleeding. Bowel function is normal. Bladder function is normal. Patient is sexually active. Contraception method is tubal ligation.  Postpartum depression screening: negative, score 0.  The following portions of the patient's history were reviewed and updated as appropriate: allergies, current medications, past family history, past medical history, past social history, past surgical history and problem list.  Review of Systems A comprehensive review of systems was negative.    Objective:  Blood pressure (!) 147/108, pulse 87, weight 201 lb (91.2 kg), last menstrual period 03/18/2019.  General:  alert   Breasts:  inspection negative, no nipple discharge or bleeding, no masses or nodularity palpable  Lungs: clear to auscultation bilaterally  Heart:  regular rate and rhythm, S1, S2 normal, no murmur, click, rub or gallop  Abdomen: soft, non-tender; bowel sounds normal; no masses,  no organomegaly and incision is clean, dry and intact    Assessment:    1. Postpartum care following cesarean delivery - doing well  2. Visit for wound check - healing well  3. HTN (hypertension), benign - on Procardia.  Stable. - needs referral to PCP  4. Obesity (BMI 30.0-34.9) - program of caloric reduction, exercise and behavioral modification recommended   Plan:   Essential components of care per ACOG recommendations:  1.  Mood and well being: Patient with negative depression screening today. Reviewed local resources for support.  - Patient does  not use tobacco.  - hx of drug use? No    2. Infant care and feeding:  -Patient currently breastmilk feeding? Yes.  If breastmilk feeding discussed return to work and pumping. If needed, patient was provided letter for work to allow for every 2-3 hr pumping breaks, and to be granted a private location to express breastmilk and refrigerated area to store breastmilk. Reviewed importance of draining breast regularly to support lactation. -Social determinants of health (SDOH) reviewed in EPIC. No concerns.The following needs were identified:  Routine Health Maintenance for BP, obesity, etc.  3. Sexuality, contraception and birth spacing - Patient does not want a pregnancy in the next year.  Desired family size is 2 children.  - Reviewed forms of contraception in tiered fashion. Patient desired bilateral tubal ligation today.   - Discussed birth spacing of 18 months  4. Sleep and fatigue -Encouraged family/partner/community support of 4 hrs of uninterrupted sleep to help with mood and fatigue  5. Physical Recovery  - Discussed patients delivery by C/S - Patient has urinary incontinence? No - Patient is not safe to resume physical and sexual activity  6.  Health Maintenance - Last pap smear done 06-07-2019 and was normal with negative HPV.   Brock Bad, MD 11/13/2019 11:35 AM

## 2019-11-27 ENCOUNTER — Ambulatory Visit: Payer: Managed Care, Other (non HMO) | Admitting: Obstetrics

## 2019-12-02 ENCOUNTER — Emergency Department: Payer: Managed Care, Other (non HMO)

## 2019-12-02 ENCOUNTER — Encounter: Payer: Self-pay | Admitting: Emergency Medicine

## 2019-12-02 ENCOUNTER — Other Ambulatory Visit: Payer: Self-pay

## 2019-12-02 ENCOUNTER — Emergency Department
Admission: EM | Admit: 2019-12-02 | Discharge: 2019-12-02 | Disposition: A | Discharge status: Home / Self Care | Payer: Managed Care, Other (non HMO) | Attending: Emergency Medicine | Admitting: Emergency Medicine

## 2019-12-02 DIAGNOSIS — X58XXXA Exposure to other specified factors, initial encounter: Secondary | ICD-10-CM | POA: Diagnosis not present

## 2019-12-02 DIAGNOSIS — S301XXA Contusion of abdominal wall, initial encounter: Secondary | ICD-10-CM | POA: Diagnosis not present

## 2019-12-02 DIAGNOSIS — Z79899 Other long term (current) drug therapy: Secondary | ICD-10-CM | POA: Insufficient documentation

## 2019-12-02 DIAGNOSIS — Z87891 Personal history of nicotine dependence: Secondary | ICD-10-CM | POA: Insufficient documentation

## 2019-12-02 DIAGNOSIS — Y929 Unspecified place or not applicable: Secondary | ICD-10-CM | POA: Diagnosis not present

## 2019-12-02 DIAGNOSIS — S3991XA Unspecified injury of abdomen, initial encounter: Secondary | ICD-10-CM | POA: Diagnosis present

## 2019-12-02 DIAGNOSIS — Y939 Activity, unspecified: Secondary | ICD-10-CM | POA: Diagnosis not present

## 2019-12-02 DIAGNOSIS — I10 Essential (primary) hypertension: Secondary | ICD-10-CM | POA: Insufficient documentation

## 2019-12-02 DIAGNOSIS — Y998 Other external cause status: Secondary | ICD-10-CM | POA: Diagnosis not present

## 2019-12-02 LAB — CBC
HCT: 38.5 % (ref 36.0–46.0)
Hemoglobin: 12.7 g/dL (ref 12.0–15.0)
MCH: 30.4 pg (ref 26.0–34.0)
MCHC: 33 g/dL (ref 30.0–36.0)
MCV: 92.1 fL (ref 80.0–100.0)
Platelets: 360 10*3/uL (ref 150–400)
RBC: 4.18 MIL/uL (ref 3.87–5.11)
RDW: 11.9 % (ref 11.5–15.5)
WBC: 7.9 10*3/uL (ref 4.0–10.5)
nRBC: 0 % (ref 0.0–0.2)

## 2019-12-02 LAB — COMPREHENSIVE METABOLIC PANEL WITH GFR
ALT: 19 U/L (ref 0–44)
AST: 20 U/L (ref 15–41)
Albumin: 3.6 g/dL (ref 3.5–5.0)
Alkaline Phosphatase: 66 U/L (ref 38–126)
Anion gap: 8 (ref 5–15)
BUN: 8 mg/dL (ref 6–20)
CO2: 23 mmol/L (ref 22–32)
Calcium: 9.3 mg/dL (ref 8.9–10.3)
Chloride: 108 mmol/L (ref 98–111)
Creatinine, Ser: 0.56 mg/dL (ref 0.44–1.00)
GFR calc Af Amer: >60 mL/min
GFR calc non Af Amer: >60 mL/min
Glucose, Bld: 103 mg/dL — ABNORMAL HIGH (ref 70–99)
Potassium: 3.5 mmol/L (ref 3.5–5.1)
Sodium: 139 mmol/L (ref 135–145)
Total Bilirubin: 0.8 mg/dL (ref 0.3–1.2)
Total Protein: 7 g/dL (ref 6.5–8.1)

## 2019-12-02 LAB — LIPASE, BLOOD: Lipase: 37 U/L (ref 11–51)

## 2019-12-02 LAB — POCT PREGNANCY, URINE: Preg Test, Ur: NEGATIVE

## 2019-12-02 LAB — URINALYSIS, COMPLETE (UACMP) WITH MICROSCOPIC
Bilirubin Urine: NEGATIVE
Glucose, UA: NEGATIVE mg/dL
Hgb urine dipstick: NEGATIVE
Leukocytes,Ua: NEGATIVE
Nitrite: NEGATIVE
Protein, ur: 100 mg/dL — AB
Specific Gravity, Urine: >1.03 — ABNORMAL HIGH (ref 1.005–1.030)
pH: 5.5 (ref 5.0–8.0)

## 2019-12-02 MED ORDER — SODIUM CHLORIDE 0.9% FLUSH: 3.0000 mL | Freq: Once | INTRAVENOUS | Status: DC

## 2019-12-02 NOTE — ED Triage Notes (Addendum)
Pt here with c/o LLQ pain that began a week ago, states without ibuprofen, it hurts to the touch, denies N,V,D. NAD. Csection in march, is having periods.

## 2019-12-02 NOTE — Discharge Instructions (Addendum)
Please follow-up with your doctor in 1 to 2 weeks for recheck/reevaluation.  Return to the emergency department for any sudden increase in abdominal pain, weakness, fatigue, or any other symptom personally concerning to yourself.

## 2019-12-02 NOTE — ED Provider Notes (Signed)
[facility] Emergency Department Provider Note  Time seen: 1:37 PM  I have reviewed the triage vital signs and the nursing notes.   HISTORY  Chief Complaint Abdominal Pain   HPI [name] [name] is a 24 y.o. female with a past medical history of hypertension, UTI, presents to the emergency department for left lower quadrant abdominal discomfort.  According to the patient for the past 1 week or so she has felt a mass to her left lower quadrant has been experiencing some mild pain to this area.  Denies any dysuria hematuria, vaginal bleeding or discharge.  Last period was approximately 1 week ago.  States normal bowel movements.  Denies any constipation nausea or vomiting.  Describes pain as a mild dull discomfort somewhat better when wearing an abdominal binder.   Past Medical History:  Diagnosis Date  . Chlamydia   . Hypertension   . Infection    UTI  . Ovarian cyst   . Pregnancy induced hypertension     Patient Active Problem List   Diagnosis Date Noted  . Indication for care in labor and delivery, antepartum 10/11/2019  . Chronic hypertension with superimposed pre-eclampsia 10/11/2019  . H/O: C-section 08/12/2019  . History of preterm delivery 08/12/2019  . Chronic hypertension in pregnancy 06/07/2019  . Supervision of high risk pregnancy, antepartum 05/28/2019  . Sepsis ([place]) 12/29/2016    Past Surgical History:  Procedure Laterality Date  . CESAREAN SECTION N/A 03/21/2013   Procedure: CESAREAN SECTION;  Surgeon: Lavonia Drafts, MD;  Location: [facility];  Service: Obstetrics;  Laterality: N/A;  . CESAREAN SECTION N/A 01/06/2016   Procedure: CESAREAN SECTION;  Surgeon: Mora Bellman, MD;  Location: [facility];  Service: Obstetrics;  Laterality: N/A;    Prior to Admission medications   Medication Sig Start Date End Date Taking? Authorizing Provider  NIFEdipine (PROCARDIA-XL/NIFEDICAL-XL) 30 MG 24 hr tablet Take 30 mg by mouth daily.     [provider]  Prenatal Vit-Fe Fumarate-FA (PRENATAL MULTIVITAMIN) TABS tablet Take 1 tablet by mouth daily at 12 noon.    [provider]    Allergies  Allergen Reactions  . Shellfish Allergy     Family History  Problem Relation Age of Onset  . Asthma Mother   . Cancer Paternal Grandfather     Social History Social History   Tobacco Use  . Smoking status: Former Smoker    Packs/day: 0.50    Types: Cigarettes  . Smokeless tobacco: Never Used  . Tobacco comment: Stopped when she found out she was pregnant  Substance Use Topics  . Alcohol use: No  . Drug use: No    Review of Systems Constitutional: Negative for fever. Cardiovascular: Negative for chest pain. Respiratory: Negative for shortness of breath. Gastrointestinal: Left lower quadrant abdominal discomfort.  Negative for nausea vomiting diarrhea or constipation. Genitourinary: Negative for urinary compaints Musculoskeletal: Negative for musculoskeletal complaints Neurological: Negative for headache All other ROS negative  ____________________________________________   PHYSICAL EXAM:  VITAL SIGNS: ED Triage Vitals  Enc Vitals Group     BP 12/02/19 1129 (!) 156/98     Pulse Rate 12/02/19 1129 62     Resp 12/02/19 1129 18     Temp 12/02/19 1129 98.6 F (37 C)     Temp Source 12/02/19 1129 Oral     SpO2 12/02/19 1129 98 %     Weight 12/02/19 1130 201 lb (91.2 kg)     Height 12/02/19 1130 5\' 6"  (1.676 m)  Head Circumference --      Peak Flow --      Pain Score 12/02/19 1130 5     Pain Loc --      Pain Edu? --      Excl. in GC? --     Constitutional: Alert and oriented. Well appearing and in no distress. Eyes: Normal exam ENT      Head: Normocephalic and atraumatic.      Mouth/Throat: Mucous membranes are moist. Cardiovascular: Normal rate, regular rhythm. Respiratory: Normal respiratory effort without tachypnea nor retractions. Breath sounds are clear Gastrointestinal: Soft,  mild fullness/mass palpated in left lower quadrant.  Mild tenderness palpation over this area.  No rebound guarding or distention.  Abdomen otherwise benign. Musculoskeletal: Nontender with normal range of motion in all extremities.  Neurologic:  Normal speech and language. No gross focal neurologic deficits  Skin:  Skin is warm, dry and intact.  Psychiatric: Mood and affect are normal.   ____________________________________________     RADIOLOGY  CT consistent with rectus abdominal wall hematoma  ____________________________________________   INITIAL IMPRESSION / ASSESSMENT AND PLAN / ED COURSE  Pertinent labs & imaging results that were available during my care of the patient were reviewed by me and considered in my medical decision making (see chart for details).   Patient presents to the emergency department for a mass to her left lower quadrant x1 week with pain to this area.  Denies any diarrhea or constipation.  Denies vaginal symptoms discharge dysuria or hematuria.  Patient's labs are largely within normal limits including a negative pregnancy test.  Will obtain a CT scan to further evaluate.  Patient agreeable to plan of care.  Differential would include hernia, ovarian cyst, pelvic abscess colitis or diverticulitis.  CT consistent with left lower quadrant rectus muscle hematoma.  Patient denies any trauma.  Appears to be a spontaneous hematoma.  Hemoglobin is reassuring.  Pain has been ongoing x7 days at least per patient, likely indicating a stable hematoma.  Discussed with the patient using warm compresses for 20 to 30 minutes every several hours, abdominal binder would be okay as it relieves the patient's discomfort per her.  We will have the patient follow-up with her doctor in 1 to 2 weeks for recheck.  I discussed return precautions for any acute worsening of pain weakness or fatigue.  [name] [name] was evaluated in Emergency Department on 12/02/2019 for the symptoms  described in the history of present illness. She was evaluated in the context of the global COVID-19 pandemic, which necessitated consideration that the patient might be at risk for infection with the SARS-CoV-2 virus that causes COVID-19. Institutional protocols and algorithms that pertain to the evaluation of patients at risk for COVID-19 are in a state of rapid change based on information released by regulatory bodies including the CDC and federal and state organizations. These policies and algorithms were followed during the patient's care in the ED.  ____________________________________________   FINAL CLINICAL IMPRESSION(S) / ED DIAGNOSES  Left lower quadrant pain Abdominal wall hematoma   Minna Antis, MD 12/02/19 1419

## 2019-12-02 NOTE — ED Notes (Signed)
Post partum 1 month ago c/o of pain to left side abd. Reports feels like she has a hernia. Patient wearing abd binder, awaiting md eval and plan of care.

## 2020-07-31 ENCOUNTER — Other Ambulatory Visit: Payer: Self-pay

## 2020-07-31 ENCOUNTER — Emergency Department
Admission: EM | Admit: 2020-07-31 | Discharge: 2020-07-31 | Disposition: A | Discharge status: Home / Self Care | Payer: Managed Care, Other (non HMO) | Attending: Emergency Medicine | Admitting: Emergency Medicine

## 2020-07-31 DIAGNOSIS — R070 Pain in throat: Secondary | ICD-10-CM | POA: Diagnosis present

## 2020-07-31 DIAGNOSIS — B349 Viral infection, unspecified: Secondary | ICD-10-CM | POA: Insufficient documentation

## 2020-07-31 DIAGNOSIS — Z87891 Personal history of nicotine dependence: Secondary | ICD-10-CM | POA: Insufficient documentation

## 2020-07-31 DIAGNOSIS — Z113 Encounter for screening for infections with a predominantly sexual mode of transmission: Secondary | ICD-10-CM | POA: Diagnosis not present

## 2020-07-31 DIAGNOSIS — J029 Acute pharyngitis, unspecified: Secondary | ICD-10-CM

## 2020-07-31 DIAGNOSIS — I10 Essential (primary) hypertension: Secondary | ICD-10-CM | POA: Insufficient documentation

## 2020-07-31 DIAGNOSIS — R21 Rash and other nonspecific skin eruption: Secondary | ICD-10-CM | POA: Diagnosis not present

## 2020-07-31 DIAGNOSIS — Z20822 Contact with and (suspected) exposure to covid-19: Secondary | ICD-10-CM | POA: Insufficient documentation

## 2020-07-31 LAB — URINALYSIS, COMPLETE (UACMP) WITH MICROSCOPIC
Bacteria, UA: NONE SEEN
Bilirubin Urine: NEGATIVE
Glucose, UA: NEGATIVE mg/dL
Hgb urine dipstick: NEGATIVE
Ketones, ur: NEGATIVE mg/dL
Leukocytes,Ua: NEGATIVE
Nitrite: NEGATIVE
Protein, ur: NEGATIVE mg/dL
Specific Gravity, Urine: 1.018 (ref 1.005–1.030)
pH: 6 (ref 5.0–8.0)

## 2020-07-31 LAB — CHLAMYDIA/NGC RT PCR (ARMC ONLY)
Chlamydia Tr: NOT DETECTED
N gonorrhoeae: DETECTED — AB

## 2020-07-31 LAB — GROUP A STREP BY PCR: Group A Strep by PCR: NOT DETECTED

## 2020-07-31 LAB — POC SARS CORONAVIRUS 2 AG -  ED: SARS Coronavirus 2 Ag: NEGATIVE

## 2020-07-31 MED ORDER — BENZONATATE 100 MG PO CAPS: 200.0000 mg | ORAL_CAPSULE | Freq: Three times a day (TID) | ORAL | 0 refills | Status: AC | PRN

## 2020-07-31 MED ORDER — METHYLPREDNISOLONE 4 MG PO TBPK
ORAL_TABLET | ORAL | 0 refills | Status: AC
Start: 2020-07-31 — End: ?

## 2020-07-31 MED ORDER — HYDROXYZINE HCL 50 MG PO TABS
50.0000 mg | ORAL_TABLET | Freq: Three times a day (TID) | ORAL | 0 refills | Status: AC | PRN
Start: 2020-07-31 — End: ?

## 2020-07-31 NOTE — Discharge Instructions (Signed)
You may follow-up the results of your STD and Covid test and the MyChart app.  Follow discharge care instruction take medication as directed.  He also will be notified telephonically if your STD test is positive and told when to come in to be treated.  If you do not wish to come back to the emergency room you may consult appointment with [facility] department that is listed in your discharge care instructions.

## 2020-07-31 NOTE — ED Triage Notes (Signed)
FIRST NURSE NOTE:  Pt states she thinks she has strep throat and hives from possibly a new detergent she is using. No distress noted.

## 2020-07-31 NOTE — ED Triage Notes (Signed)
Pt presents via POV c/o sore throat x1 week and rash that started this am. Also requesting STD check.

## 2020-07-31 NOTE — ED Provider Notes (Signed)
[facility] Emergency Department Provider Note   ____________________________________________   Event Date/Time   First MD Initiated Contact with Patient 07/31/20 [phone]     (approximate)  I have reviewed the triage vital signs and the nursing notes.   HISTORY  Chief Complaint Sore Throat and Rash    HPI [name] [name] is a 24 y.o. female patient presents with multiple complaints.  Patient believes she has strep throat.  Patient also complain having high status post using a new detergent.  Patient also requesting STD check.  Patient stated her significant other came to the ED for STD and was treated prophylactically with negative results.  Patient also denies recent travel or known contact with COVID-19.  Patient wished to be tested for COVID-19.  Patient has not taken the COVID-19 vaccine or flu shot.       Past Medical History:  Diagnosis Date  . Chlamydia   . Hypertension   . Infection    UTI  . Ovarian cyst   . Pregnancy induced hypertension     Patient Active Problem List   Diagnosis Date Noted  . Indication for care in labor and delivery, antepartum 10/11/2019  . Chronic hypertension with superimposed pre-eclampsia 10/11/2019  . H/O: C-section 08/12/2019  . History of preterm delivery 08/12/2019  . Chronic hypertension in pregnancy 06/07/2019  . Supervision of high risk pregnancy, antepartum 05/28/2019  . Sepsis ([facility]) 12/29/2016    Past Surgical History:  Procedure Laterality Date  . CESAREAN SECTION N/A 03/21/2013   Procedure: CESAREAN SECTION;  Surgeon: Willodean Rosenthal, MD;  Location: [facility];  Service: Obstetrics;  Laterality: N/A;  . CESAREAN SECTION N/A 01/06/2016   Procedure: CESAREAN SECTION;  Surgeon: Catalina Antigua, MD;  Location: [facility];  Service: Obstetrics;  Laterality: N/A;    Prior to Admission medications   Medication Sig Start Date End Date Taking? Authorizing Provider  benzonatate (TESSALON PERLES)  100 MG capsule Take 2 capsules (200 mg total) by mouth 3 (three) times daily as needed. 07/31/20 07/31/21 Yes Joni Reining, PA-C  hydrOXYzine (ATARAX/VISTARIL) 50 MG tablet Take 1 tablet (50 mg total) by mouth 3 (three) times daily as needed for itching. 07/31/20  Yes Joni Reining, PA-C  methylPREDNISolone (MEDROL DOSEPAK) 4 MG TBPK tablet Take Tapered dose as directed 07/31/20  Yes Joni Reining, PA-C  NIFEdipine (PROCARDIA-XL/NIFEDICAL-XL) 30 MG 24 hr tablet Take 30 mg by mouth daily.    [provider]  Prenatal Vit-Fe Fumarate-FA (PRENATAL MULTIVITAMIN) TABS tablet Take 1 tablet by mouth daily at 12 noon.    [provider]    Allergies Shellfish allergy  Family History  Problem Relation Age of Onset  . Asthma Mother   . Cancer Paternal Grandfather     Social History Social History   Tobacco Use  . Smoking status: Former Smoker    Packs/day: 0.50    Types: Cigarettes  . Smokeless tobacco: Never Used  . Tobacco comment: Stopped when she found out she was pregnant  Vaping Use  . Vaping Use: Never used  Substance Use Topics  . Alcohol use: No  . Drug use: No    Review of Systems Constitutional: No fever/chills Eyes: No visual changes. ENT: Sore throat. Cardiovascular: Denies chest pain. Respiratory: Denies shortness of breath. Gastrointestinal: No abdominal pain.  No nausea, no vomiting.  No diarrhea.  No constipation. Genitourinary: Negative for dysuria. Musculoskeletal: Negative for back pain. Skin: Positive for rash. Neurological: Negative for headaches, focal weakness  or numbness. Allergic/Immunilogical: Shellfish allergy.  ____________________________________________   PHYSICAL EXAM:  VITAL SIGNS: ED Triage Vitals [07/31/20 0738]  Enc Vitals Group     BP (!) 148/101     Pulse Rate 96     Resp 14     Temp 99.2 F (37.3 C)     Temp Source Oral     SpO2 98 %     Weight      Height      Head Circumference      Peak Flow       Pain Score 6     Pain Loc      Pain Edu?      Excl. in GC?     Constitutional: Alert and oriented. Well appearing and in no acute distress. Nose: No congestion/rhinnorhea. Mouth/Throat: Mucous membranes are moist.  Oropharynx non-erythematous.  No visible exudate. Neck: No stridor. Hematological/Lymphatic/Immunilogical: No cervical lymphadenopathy. Cardiovascular: Normal rate, regular rhythm. Grossly normal heart sounds.  Good peripheral circulation.  Elevated blood pressure. Respiratory: Normal respiratory effort.  No retractions. Lungs CTAB. Gastrointestinal: Soft and nontender. No distention. No abdominal bruits. No CVA tenderness. Genitourinary: Deferred Skin:  Skin is warm, dry and intact.  Fine macular lesions diffuse sparing the face.   Psychiatric: Mood and affect are normal. Speech and behavior are normal.  ____________________________________________   LABS (all labs ordered are listed, but only abnormal results are displayed)  Labs Reviewed  URINALYSIS, COMPLETE (UACMP) WITH MICROSCOPIC - Abnormal; Notable for the following components:      Result Value   Color, Urine YELLOW (*)    APPearance HAZY (*)    All other components within normal limits  GROUP A STREP BY PCR  CHLAMYDIA/NGC RT PCR ([facility] ONLY)  SARS CORONAVIRUS 2 (TAT 6-24 HRS)  POC SARS CORONAVIRUS 2 AG -  ED   ____________________________________________  EKG   ____________________________________________  RADIOLOGY Margarite Gouge, personally viewed and evaluated these images (plain radiographs) as part of my medical decision making, as well as reviewing the written report by the radiologist.  ED MD interpretation:    Official radiology report(s): No results found.  ____________________________________________   PROCEDURES  Procedure(s) performed (including Critical Care):  Procedures   ____________________________________________   INITIAL IMPRESSION / ASSESSMENT AND PLAN  / ED COURSE  As part of my medical decision making, I reviewed the following data within the electronic MEDICAL RECORD NUMBER         Patient presents with sore throat and rash.  Patient also requests evaluation for STD.  Discussed negative rapid strep test.  Discussed negative COVID-19 rapid test.  Patient advised confirmatory test is pending.  Discussed no acute findings on UA.  Advise chlamydia and gonorrhea results are pending.  Patient given discharge care instructions and advised to follow-up lab results in the MyChart app.  Patient also given instruction to follow-up with the [facility] department if she did not wish to return to the emergency room for STD complaint.  Take medication as directed.      ____________________________________________   FINAL CLINICAL IMPRESSION(S) / ED DIAGNOSES  Final diagnoses:  Sore throat  Rash  Viral illness     ED Discharge Orders         Ordered    hydrOXYzine (ATARAX/VISTARIL) 50 MG tablet  3 times daily PRN        07/31/20 0924    methylPREDNISolone (MEDROL DOSEPAK) 4 MG TBPK tablet        07/31/20 8921  benzonatate (TESSALON PERLES) 100 MG capsule  3 times daily PRN        07/31/20 2725          *Please note:  [name] [name] was evaluated in Emergency Department on 07/31/2020 for the symptoms described in the history of present illness. She was evaluated in the context of the global COVID-19 pandemic, which necessitated consideration that the patient might be at risk for infection with the SARS-CoV-2 virus that causes COVID-19. Institutional protocols and algorithms that pertain to the evaluation of patients at risk for COVID-19 are in a state of rapid change based on information released by regulatory bodies including the CDC and federal and state organizations. These policies and algorithms were followed during the patient's care in the ED.  Some ED evaluations and interventions may be delayed as a result of limited  staffing during and the pandemic.*   Note:  This document was prepared using Dragon voice recognition software and may include unintentional dictation errors.    Joni Reining, PA-C 07/31/20 0930    Dionne Bucy, MD 07/31/20 [phone]

## 2020-08-01 ENCOUNTER — Other Ambulatory Visit: Payer: Self-pay

## 2020-08-01 ENCOUNTER — Emergency Department
Admission: EM | Admit: 2020-08-01 | Discharge: 2020-08-01 | Disposition: A | Discharge status: Home / Self Care | Payer: Managed Care, Other (non HMO) | Attending: Emergency Medicine | Admitting: Emergency Medicine

## 2020-08-01 DIAGNOSIS — Z87891 Personal history of nicotine dependence: Secondary | ICD-10-CM | POA: Insufficient documentation

## 2020-08-01 DIAGNOSIS — R109 Unspecified abdominal pain: Secondary | ICD-10-CM | POA: Insufficient documentation

## 2020-08-01 DIAGNOSIS — R030 Elevated blood-pressure reading, without diagnosis of hypertension: Secondary | ICD-10-CM

## 2020-08-01 DIAGNOSIS — I10 Essential (primary) hypertension: Secondary | ICD-10-CM | POA: Diagnosis not present

## 2020-08-01 DIAGNOSIS — A549 Gonococcal infection, unspecified: Secondary | ICD-10-CM | POA: Insufficient documentation

## 2020-08-01 LAB — SARS CORONAVIRUS 2 (TAT 6-24 HRS): SARS Coronavirus 2: NEGATIVE

## 2020-08-01 LAB — URINALYSIS, COMPLETE (UACMP) WITH MICROSCOPIC
Bacteria, UA: NONE SEEN
Bilirubin Urine: NEGATIVE
Glucose, UA: NEGATIVE mg/dL
Hgb urine dipstick: NEGATIVE
Ketones, ur: NEGATIVE mg/dL
Leukocytes,Ua: NEGATIVE
Nitrite: NEGATIVE
Protein, ur: NEGATIVE mg/dL
Specific Gravity, Urine: 1.017 (ref 1.005–1.030)
pH: 5 (ref 5.0–8.0)

## 2020-08-01 MED ORDER — LIDOCAINE HCL (PF) 1 % IJ SOLN
INTRAMUSCULAR | Status: AC
  Filled 2020-08-01: qty 5

## 2020-08-01 MED ORDER — CEFTRIAXONE SODIUM 1 G IJ SOLR
500.0000 mg | Freq: Once | INTRAMUSCULAR | Status: AC
  Administered 2020-08-01: 500 mg via INTRAMUSCULAR
  Filled 2020-08-01: qty 10

## 2020-08-01 NOTE — Discharge Instructions (Signed)
Follow-up with your primary care provider or [facility] department if any continued problems.  Today you were treated with Rocephin which will treat gonorrhea which was positive on your test.

## 2020-08-01 NOTE — ED Provider Notes (Signed)
[facility] Emergency Department Provider Note   ____________________________________________   Event Date/Time   First MD Initiated Contact with Patient 08/01/20 0932     (approximate)  I have reviewed the triage vital signs and the nursing notes.   HISTORY  Chief Complaint std treatment   HPI [name] [name] is a 25 y.o. female presents to the ED for treatment of gonorrhea.  Patient was seen in the ED yesterday with multiple complaints and also to have a Covid test and be checked for STDs.  Patient saw on my chart that the results of her gonorrhea test was positive.  Patient denies any symptoms at this time other than some mild abdominal cramping.  She also states that her partner called to let her know that they were being treated for gonorrhea as well.  Currently she rates her pain as a 0/10.       Past Medical History:  Diagnosis Date  . Chlamydia   . Hypertension   . Infection    UTI  . Ovarian cyst   . Pregnancy induced hypertension     Patient Active Problem List   Diagnosis Date Noted  . Indication for care in labor and delivery, antepartum 10/11/2019  . Chronic hypertension with superimposed pre-eclampsia 10/11/2019  . H/O: C-section 08/12/2019  . History of preterm delivery 08/12/2019  . Chronic hypertension in pregnancy 06/07/2019  . Supervision of high risk pregnancy, antepartum 05/28/2019  . Sepsis (HCC) 12/29/2016    Past Surgical History:  Procedure Laterality Date  . CESAREAN SECTION N/A 03/21/2013   Procedure: CESAREAN SECTION;  Surgeon: Willodean Rosenthal, MD;  Location: [facility];  Service: Obstetrics;  Laterality: N/A;  . CESAREAN SECTION N/A 01/06/2016   Procedure: CESAREAN SECTION;  Surgeon: Catalina Antigua, MD;  Location: [facility];  Service: Obstetrics;  Laterality: N/A;    Prior to Admission medications   Medication Sig Start Date End Date Taking? Authorizing Provider  benzonatate (TESSALON PERLES) 100 MG  capsule Take 2 capsules (200 mg total) by mouth 3 (three) times daily as needed. 07/31/20 07/31/21  Joni Reining, PA-C  hydrOXYzine (ATARAX/VISTARIL) 50 MG tablet Take 1 tablet (50 mg total) by mouth 3 (three) times daily as needed for itching. 07/31/20   Joni Reining, PA-C  methylPREDNISolone (MEDROL DOSEPAK) 4 MG TBPK tablet Take Tapered dose as directed 07/31/20   Joni Reining, PA-C  NIFEdipine (PROCARDIA-XL/NIFEDICAL-XL) 30 MG 24 hr tablet Take 30 mg by mouth daily.    [provider]  Prenatal Vit-Fe Fumarate-FA (PRENATAL MULTIVITAMIN) TABS tablet Take 1 tablet by mouth daily at 12 noon.    [provider]    Allergies Shellfish allergy  Family History  Problem Relation Age of Onset  . Asthma Mother   . Cancer Paternal Grandfather     Social History Social History   Tobacco Use  . Smoking status: Former Smoker    Packs/day: 0.50    Types: Cigarettes  . Smokeless tobacco: Never Used  . Tobacco comment: Stopped when she found out she was pregnant  Vaping Use  . Vaping Use: Never used  Substance Use Topics  . Alcohol use: No  . Drug use: No    Review of Systems Constitutional: No fever/chills Eyes: No visual changes. ENT: No sore throat. Cardiovascular: Denies chest pain. Respiratory: Denies shortness of breath. Gastrointestinal: No abdominal pain.  No nausea, no vomiting.   Genitourinary: Negative for dysuria.  Positive mild cramping but denies vaginal discharge. Musculoskeletal: Negative  for back pain. Skin: Negative for rash. Neurological: Negative for headaches, focal weakness or numbness. ____________________________________________   PHYSICAL EXAM:  VITAL SIGNS: ED Triage Vitals [08/01/20 0742]  Enc Vitals Group     BP (!) 168/106     Pulse Rate (!) 115     Resp 18     Temp 98.3 F (36.8 C)     Temp Source Oral     SpO2 97 %     Weight 202 lb 13.2 oz (92 kg)     Height 5\' 6"  (1.676 m)     Head Circumference      Peak Flow       Pain Score 0     Pain Loc      Pain Edu?      Excl. in [place]?     Constitutional: Alert and oriented. Well appearing and in no acute distress. Eyes: Conjunctivae are normal.  Head: Atraumatic. Neck: No stridor.   Cardiovascular: Normal rate, regular rhythm. Grossly normal heart sounds.  Good peripheral circulation. Respiratory: Normal respiratory effort.  No retractions. Lungs CTAB. Gastrointestinal: Soft and nontender. No distention.  Musculoskeletal: Moves upper and lower extremities no difficulty.  Normal gait was noted. Neurologic:  Normal speech and language. No gross focal neurologic deficits are appreciated.  Skin:  Skin is warm, dry and intact. No rash noted. Psychiatric: Mood and affect are normal. Speech and behavior are normal.  ____________________________________________   LABS (all labs ordered are listed, but only abnormal results are displayed)  Labs Reviewed  URINALYSIS, COMPLETE (UACMP) WITH MICROSCOPIC - Abnormal; Notable for the following components:      Result Value   Color, Urine YELLOW (*)    APPearance CLEAR (*)    All other components within normal limits  POC URINE PREG, ED    PROCEDURES  Procedure(s) performed (including Critical Care):  Procedures   ____________________________________________   INITIAL IMPRESSION / ASSESSMENT AND PLAN / ED COURSE  As part of my medical decision making, I reviewed the following data within the electronic MEDICAL RECORD NUMBER Notes from prior ED visits and [place] Controlled Substance Database  25 year old female presents to the ED after saying that her test results from yesterday on my chart were positive for gonorrhea.  Patient states her partner has already been tested and treated.  Patient was given Rocephin IM while in the ED.  Blood pressure was improved prior to discharge.  She is to follow-up with her PCP or [place] if any continued  problems.  ____________________________________________   FINAL CLINICAL IMPRESSION(S) / ED DIAGNOSES  Final diagnoses:  Gonorrhea  Elevated blood pressure reading     ED Discharge Orders    None      *Please note:  [name] was evaluated in Emergency Department on 08/01/2020 for the symptoms described in the history of present illness. She was evaluated in the context of the global COVID-19 pandemic, which necessitated consideration that the patient might be at risk for infection with the SARS-CoV-2 virus that causes COVID-19. Institutional protocols and algorithms that pertain to the evaluation of patients at risk for COVID-19 are in a state of rapid change based on information released by regulatory bodies including the CDC and federal and state organizations. These policies and algorithms were followed during the patient's care in the ED.  Some ED evaluations and interventions may be delayed as a result of limited staffing during and the pandemic.*   Note:  This document was prepared using Dragon  voice recognition software and may include unintentional dictation errors.    Tommi Rumps, PA-C 08/01/20 1339    Minna Antis, MD 08/01/20 1348

## 2020-08-01 NOTE — ED Triage Notes (Signed)
Pt was called after std check was positive. Only symptom at this time is minor pelvic cramping.

## 2021-08-09 ENCOUNTER — Encounter: Payer: Managed Care, Other (non HMO) | Admitting: Physical Therapy

## 2021-08-20 ENCOUNTER — Encounter: Payer: Managed Care, Other (non HMO) | Admitting: Physical Therapy

## 2022-03-15 ENCOUNTER — Ambulatory Visit: Payer: Medicaid Other | Admitting: Obstetrics & Gynecology

## 2022-03-29 ENCOUNTER — Telehealth: Payer: Self-pay | Admitting: Obstetrics & Gynecology

## 2022-03-29 ENCOUNTER — Ambulatory Visit: Payer: Self-pay | Admitting: Obstetrics & Gynecology

## 2022-03-29 NOTE — Telephone Encounter (Signed)
Reached out to pt to reschedule colpo that was originally scheduled for 8/29 at 9:55 with Dr. Marice Potter.  Left message for pt to call back to reschedule.

## 2022-03-30 ENCOUNTER — Encounter: Payer: Self-pay | Admitting: Obstetrics & Gynecology

## 2022-03-30 NOTE — Telephone Encounter (Signed)
Reached out (x2) to pt to reschedule colpo that was scheduled on 8/29 at 9:55 with Dr. Marice Potter.  Left message (x2) for pt to call back to reschedule.  Will send a MyChart letter as well.

## 2022-05-09 ENCOUNTER — Encounter: Payer: Self-pay | Admitting: Obstetrics & Gynecology

## 2023-03-01 DIAGNOSIS — H5213 Myopia, bilateral: Secondary | ICD-10-CM | POA: Diagnosis not present

## 2023-06-20 ENCOUNTER — Encounter: Payer: 59 | Admitting: Obstetrics and Gynecology

## 2023-07-14 NOTE — Progress Notes (Unsigned)
Referring Provider:   Dudley Major, FNP   [phone] F here for [facility], but in preparing for pap, found she needed colposcopy last Nov 2020, is ok with performing procedure today and delaying WWE. <br> <br>Indications:<br>Pap smear Nov 2020 showed: ASCUS, HPV-16 POS. Prior pap(s): no records avail, but pt reports abnormal in 1999-ish. <br>LMP:<br>Contraception: <br>Number current sexual partners: <br>Number of partners in lifetime: <br>High risk partner: <br>History of STD: <br>Future fertility desired: <br>Prior cervical/vaginal findings: pt reports colposcopy in "1999-ish" was normal<br>Prior cervical treatment: (Leep / cryo)<br><br>Symptoms/History:<br>Abnormal vaginal discharge: <br>HIV: <br>Pregnant:<br>Postmenopausal:<br>DES Exposure: <br>Intermenstrual bleeding: <br>Postcoital bleeding: <br>Immunosuppressed: <br>Bleeding Problems (non-gynecological): <br>Smoking: <br>NSAID current use: <br>Other serious chronic illnesses:  HPI:  [name] [name] is a 27 y.o.  [id number]  who presents today for evaluation and management of abnormal cervical cytology.    Prior pap smears:  Date:06/14/23 NGE:XBMWU HPV: positive   Prior cervical / vaginal findings: *** Date:*** Results: ***  Prior cervical treatment(s): *** Date:*** Results: ***  Symptoms/History:  -Abnormal vaginal discharge: *** -Postmenopausal: *** -Intermenstrual bleeding: *** -Postcoital bleeding: *** -Bleeding problems (non-gyn): *** -Contraception: *** -Number of current sexual partners: *** -Number of partners in lifetime: *** -History of a high risk partner: *** -History of STDs: gonorrhoeae  -Smoking: ***      ROS:  {Ros - complete:01/07/1995}  OB History  Gravida Para Term Preterm AB Living  3 2 0 2 0 2  SAB IAB Ectopic Multiple Live Births  0 0 0 0 2    # Outcome Date GA Lbr Len/2nd Weight Sex Type Anes PTL Lv  3 Gravida           2 Preterm 01/06/16 [redacted]w[redacted]d  3 lb 13.7 oz (1.75 kg) M CS-LTranv Spinal  LIV  1 Preterm 03/21/13  [redacted]w[redacted]d  3 lb 1 oz (1.389 kg) F CS-LTranv Spinal  LIV    Past Medical History:  Diagnosis Date   Chlamydia    Hypertension    Infection    UTI   Ovarian cyst    Pregnancy induced hypertension     Past Surgical History:  Procedure Laterality Date   CESAREAN SECTION N/A 03/21/2013   Procedure: CESAREAN SECTION;  Surgeon: Willodean Rosenthal, MD;  Location: [facility];  Service: Obstetrics;  Laterality: N/A;   CESAREAN SECTION N/A 01/06/2016   Procedure: CESAREAN SECTION;  Surgeon: Catalina Antigua, MD;  Location: [facility];  Service: Obstetrics;  Laterality: N/A;    SOCIAL HISTORY:  Social History   Substance and Sexual Activity  Alcohol Use No    Social History   Substance and Sexual Activity  Drug Use No     Family History  Problem Relation Age of Onset   Asthma Mother    Cancer Paternal Grandfather     ALLERGIES:  Shellfish allergy  She has a current medication list which includes the following prescription(s): hydroxyzine, methylprednisolone, nifedipine, and prenatal multivitamin.  Physical Exam: -Vitals:  There were no vitals taken for this visit.  PROCEDURE: Colposcopy performed with 4% acetic acid after verbal consent obtained                           -Aceto-white Lesions Location(s): See above              -Biopsy performed at *** o'clock               -ECC indicated and performed: {yes no:314532}     -Biopsy sites made hemostatic with pressure and Monsel's solution   -  Satisfactory colposcopy: {yes no:314532}    -Evidence of Invasive cervical CA :  NO  ASSESSMENT:  [name] [name] is a 27 y.o. [id number] with LSIL***ASCUS and HPV-HR positive*** 16/18/45 POS***NEG on recent pap (DATE), here for colposcopy today, performed as above without complications.  -ECC and 3 cervical bx sent to pathology -Aftercare instructions for home reviewed, si/sx of when to call/return discussed. -RTC 2 weeks for results, sooner prn  No orders of the defined types were  placed in this encounter.          F/U  No follow-ups on file.   Julieanne Manson, DO [facility]

## 2023-07-17 ENCOUNTER — Encounter: Payer: 59 | Admitting: Obstetrics

## 2023-07-17 ENCOUNTER — Telehealth: Payer: Self-pay | Admitting: Obstetrics

## 2023-07-17 DIAGNOSIS — R87610 Atypical squamous cells of undetermined significance on cytologic smear of cervix (ASC-US): Secondary | ICD-10-CM

## 2023-07-17 NOTE — Telephone Encounter (Signed)
Reached out to pt to reschedule Colpo that was scheduled on 07/17/2023 at 10:15 with Dr. Lonny Prude.  Could not leave a message bc call could not be completed at the time.

## 2023-07-18 ENCOUNTER — Encounter: Payer: Self-pay | Admitting: Obstetrics

## 2023-07-18 NOTE — Telephone Encounter (Signed)
Reached out to pt (2x) to reschedule Colpo that was scheduled on 08/16/2022 at 10:15 with Dr. Lonny Prude. Could not leave a message bc call could not be completed at this time.  Will send a MyChart letter.

## 2024-03-19 ENCOUNTER — Encounter: Admitting: Obstetrics & Gynecology

## 2024-03-27 ENCOUNTER — Encounter: Payer: Self-pay | Admitting: Obstetrics & Gynecology

## 2024-07-28 ENCOUNTER — Other Ambulatory Visit: Payer: Self-pay

## 2024-07-28 ENCOUNTER — Emergency Department: Admission: EM | Admit: 2024-07-28 | Discharge: 2024-07-28 | Disposition: A | Discharge status: Home / Self Care

## 2024-07-28 DIAGNOSIS — I10 Essential (primary) hypertension: Secondary | ICD-10-CM | POA: Diagnosis not present

## 2024-07-28 DIAGNOSIS — J029 Acute pharyngitis, unspecified: Secondary | ICD-10-CM | POA: Diagnosis present

## 2024-07-28 LAB — GROUP A STREP BY PCR: Group A Strep by PCR: NOT DETECTED

## 2024-07-28 LAB — RESP PANEL BY RT-PCR (RSV, FLU A&B, COVID)  RVPGX2
Influenza A by PCR: NEGATIVE
Influenza B by PCR: NEGATIVE
Resp Syncytial Virus by PCR: NEGATIVE
SARS Coronavirus 2 by RT PCR: NEGATIVE

## 2024-07-28 MED ORDER — ACETAMINOPHEN 325 MG PO TABS
650.0000 mg | ORAL_TABLET | Freq: Once | ORAL | Status: AC
  Administered 2024-07-28: 650 mg via ORAL
  Filled 2024-07-28: qty 2

## 2024-07-28 MED ORDER — ACETAMINOPHEN 325 MG PO TABS: 650.0000 mg | ORAL_TABLET | Freq: Four times a day (QID) | ORAL | 2 refills | Status: AC | PRN

## 2024-07-28 MED ORDER — IBUPROFEN 800 MG PO TABS: 800.0000 mg | ORAL_TABLET | Freq: Three times a day (TID) | ORAL | 0 refills | Status: DC | PRN

## 2024-07-28 MED ORDER — IBUPROFEN 600 MG PO TABS
600.0000 mg | ORAL_TABLET | Freq: Once | ORAL | Status: AC
  Administered 2024-07-28: 600 mg via ORAL
  Filled 2024-07-28: qty 1

## 2024-07-28 MED ORDER — LIDOCAINE VISCOUS HCL 2 % MT SOLN: 15.0000 mL | Freq: Four times a day (QID) | OROMUCOSAL | 0 refills | Status: DC | PRN

## 2024-07-28 MED ORDER — IBUPROFEN 800 MG PO TABS: 800.0000 mg | ORAL_TABLET | Freq: Three times a day (TID) | ORAL | 0 refills | Status: AC | PRN

## 2024-07-28 MED ORDER — ACETAMINOPHEN 325 MG PO TABS: 650.0000 mg | ORAL_TABLET | Freq: Four times a day (QID) | ORAL | 2 refills | Status: DC | PRN

## 2024-07-28 MED ORDER — LIDOCAINE VISCOUS HCL 2 % MT SOLN: 15.0000 mL | Freq: Four times a day (QID) | OROMUCOSAL | 0 refills | Status: AC | PRN

## 2024-07-28 NOTE — ED Triage Notes (Signed)
 Pt c/o painful swallowing since Friday. States the roof of her mouth and underneath her tongue is burning.

## 2024-07-28 NOTE — ED Provider Notes (Addendum)
 "  [facility] Provider Note    Event Date/Time   First MD Initiated Contact with Patient 07/28/24 [phone]     (approximate)   History   Sore Throat   HPI  [name] [name] is a 28 y.o. female with PMH of hypertension and ovarian cyst presents for evaluation of sore throat that began on Friday.  Patient states that the roof of her mouth and underneath her tongue is burning.  She has taken Tylenol  and ibuprofen  but is still having pain.  She is not sure if she has had a fever.      Physical Exam   Triage Vital Signs: ED Triage Vitals  Encounter Vitals Group     BP 07/28/24 0813 (!) 158/110     Girls Systolic BP Percentile --      Girls Diastolic BP Percentile --      Boys Systolic BP Percentile --      Boys Diastolic BP Percentile --      Pulse Rate 07/28/24 0813 72     Resp 07/28/24 0813 17     Temp 07/28/24 0813 98.8 F (37.1 C)     Temp Source 07/28/24 0813 Oral     SpO2 07/28/24 0813 98 %     Weight 07/28/24 0814 165 lb (74.8 kg)     Height 07/28/24 0814 5' 6 (1.676 m)     Head Circumference --      Peak Flow --      Pain Score 07/28/24 0813 9     Pain Loc --      Pain Education --      Exclude from Growth Chart --     Most recent vital signs: Vitals:   07/28/24 0813  BP: (!) 158/110  Pulse: 72  Resp: 17  Temp: 98.8 F (37.1 C)  SpO2: 98%   General: Awake, no distress.  CV:  Good peripheral perfusion.  Resp:  Normal effort.  Abd:  No distention.  Other:  Oral mucous membranes are moist, pharynx is erythematous, mild swelling to tonsils with white exudate, no lesions or erythema noted to the roof of the mouth or underneath the tongue   ED Results / Procedures / Treatments   Labs (all labs ordered are listed, but only abnormal results are displayed) Labs Reviewed  RESP PANEL BY RT-PCR (RSV, FLU A&B, COVID)  [id number]  GROUP A STREP BY PCR     PROCEDURES:  Critical Care performed: No  Procedures   MEDICATIONS ORDERED IN  ED: Medications  acetaminophen  (TYLENOL ) tablet 650 mg (650 mg Oral Given 07/28/24 0849)  ibuprofen  (ADVIL ) tablet 600 mg (600 mg Oral Given 07/28/24 0849)     IMPRESSION / MDM / ASSESSMENT AND PLAN / ED COURSE  I reviewed the triage vital signs and the nursing notes.                             28 year old female presents for evaluation of sore throat. Patient is hypertensive but does have a history of hypertension. VSS otherwise. Patient NAD on exam.   Differential diagnosis includes, but is not limited to, strep pharyngitis, viral pharyngitis, flu, covid, rsv, tonsil stones.  Patient's presentation is most consistent with acute complicated illness / injury requiring diagnostic workup.  Will obtain strep and resp panel.   Patient's blood pressure was elevated while in the ER.  She does not currently take medication for her blood  pressure.  I did discuss close follow-up with her primary care provider for blood pressure recheck as she may need to consider taking medication.  Patient did not have any symptoms concerning for hypertensive emergency like chest pain, shortness of breath, blurry vision or headache.  Both strep test and respiratory panel were negative.  Suspect viral pharyngitis.  Recommended symptomatic management using both Tylenol  and ibuprofen .  Will also give patient a prescription for some viscous lidocaine  to help with her pain.  Advised on close follow-up with primary care provider.  She voiced understanding, all questions were answered and she was stable at discharge.      FINAL CLINICAL IMPRESSION(S) / ED DIAGNOSES   Final diagnoses:  Viral pharyngitis     Rx / DC Orders   ED Discharge Orders          Ordered    lidocaine  (XYLOCAINE ) 2 % solution  Every 6 hours PRN        07/28/24 0946    acetaminophen  (TYLENOL ) 325 MG tablet  Every 6 hours PRN        07/28/24 0946    ibuprofen  (ADVIL ) 800 MG tablet  Every 8 hours PRN        07/28/24 0946              Note:  This document was prepared using Dragon voice recognition software and may include unintentional dictation errors.   Cleaster Tinnie LABOR, PA-C 07/28/24 0947    Cleaster Tinnie LABOR, PA-C 07/28/24 9051    Nicholaus Rolland BRAVO, MD 07/28/24 1609  "

## 2024-07-28 NOTE — Discharge Instructions (Addendum)
 You tested negative for strep, flu, COVID and RSV.  You can take 650 mg of Tylenol  every 6 hours and 800 mg of ibuprofen  every 8 hours as needed for pain. You can use ice, heat, muscle creams and other topical pain relievers as well.  I have also sent lidocaine  for you to use.  This is a numbing medication to treat your pain.  Please swish, gargle and then swallow 15 mL of the lidocaine  solution every 6 hours as needed for pain.  Your blood pressure was elevated while in the emergency department, please follow-up with your primary care provider for blood pressure recheck.
